# Patient Record
Sex: Female | Born: 1950 | Hispanic: No | Smoking: Never smoker
Health system: Southern US, Community
[De-identification: ages and names within clinical notes are randomized; demographics above are authoritative.]

## PROBLEM LIST (undated history)

## (undated) ENCOUNTER — Ambulatory Visit (HOSPITAL_COMMUNITY): Admission: EM | Payer: PRIVATE HEALTH INSURANCE | Source: Home / Self Care

## (undated) DIAGNOSIS — M1990 Unspecified osteoarthritis, unspecified site: Secondary | ICD-10-CM

## (undated) DIAGNOSIS — K5790 Diverticulosis of intestine, part unspecified, without perforation or abscess without bleeding: Secondary | ICD-10-CM

## (undated) DIAGNOSIS — K8020 Calculus of gallbladder without cholecystitis without obstruction: Secondary | ICD-10-CM

## (undated) HISTORY — PX: ABDOMINAL HYSTERECTOMY: SHX81

## (undated) HISTORY — DX: Unspecified osteoarthritis, unspecified site: M19.90

## (undated) HISTORY — DX: Diverticulosis of intestine, part unspecified, without perforation or abscess without bleeding: K57.90

## (undated) HISTORY — DX: Calculus of gallbladder without cholecystitis without obstruction: K80.20

---

## 2001-05-24 ENCOUNTER — Other Ambulatory Visit: Admission: RE | Admit: 2001-05-24 | Discharge: 2001-05-24 | Payer: Self-pay | Admitting: Obstetrics and Gynecology

## 2004-08-02 ENCOUNTER — Other Ambulatory Visit: Admission: RE | Admit: 2004-08-02 | Discharge: 2004-08-02 | Payer: Self-pay | Admitting: Obstetrics and Gynecology

## 2008-01-03 ENCOUNTER — Inpatient Hospital Stay (HOSPITAL_COMMUNITY): Admission: RE | Admit: 2008-01-03 | Discharge: 2008-01-05 | Payer: Self-pay | Admitting: Obstetrics and Gynecology

## 2008-01-03 ENCOUNTER — Encounter (INDEPENDENT_AMBULATORY_CARE_PROVIDER_SITE_OTHER): Payer: Self-pay | Admitting: Obstetrics and Gynecology

## 2008-02-01 ENCOUNTER — Ambulatory Visit: Payer: Self-pay | Admitting: Internal Medicine

## 2008-02-15 ENCOUNTER — Ambulatory Visit: Payer: Self-pay | Admitting: Internal Medicine

## 2009-01-15 ENCOUNTER — Encounter (HOSPITAL_COMMUNITY): Admission: RE | Admit: 2009-01-15 | Discharge: 2009-02-15 | Payer: Self-pay | Admitting: Family Medicine

## 2010-02-27 ENCOUNTER — Ambulatory Visit (HOSPITAL_COMMUNITY): Admission: RE | Admit: 2010-02-27 | Payer: Self-pay | Source: Home / Self Care | Admitting: Obstetrics and Gynecology

## 2010-03-10 ENCOUNTER — Encounter: Payer: Self-pay | Admitting: Family Medicine

## 2010-07-02 NOTE — Op Note (Signed)
Alyssa Mccormick, Alyssa Mccormick           ACCOUNT NO.:  [id number]   MEDICAL RECORD NO.:  [id number]          PATIENT TYPE:  INP   LOCATION:  9302                          FACILITY:  [facility]   PHYSICIAN:  Guy Sandifer. Henderson Cloud, M.D. DATE OF BIRTH:  04/09/1950   DATE OF PROCEDURE:  01/03/2008  DATE OF DISCHARGE:                               OPERATIVE REPORT   PREOPERATIVE DIAGNOSIS:  Pelvic relaxation.   POSTOPERATIVE DIAGNOSIS:  Pelvic relaxation.   PROCEDURE:  Laparoscopically-assisted vaginal hysterectomy, bilateral  salpingo-oophorectomy, anterior colporrhaphy, colpopexy, insertion of  mesh, posterior colporrhaphy, transobturator mid urethral sling, and  cystoscopy.   SURGEON:  Guy Sandifer. Henderson Cloud, MD   ASSISTANT:  Duke Salvia. Marcelle Overlie, MD   ANESTHESIA:  General with endotracheal intubation, Raul Del, MD   SPECIMENS:  Uterus, bilateral tubes, and ovaries to Pathology.   ESTIMATED BLOOD LOSS:  100 mL.   INDICATIONS AND CONSENT:  This patient is a 60 year old widowed white  female G2, P2 with increasing pelvic pressure and discomfort.  Details  in dictated history and physical.  LAVH/BSO, anterior repair with mesh,  posterior repair, and mid urethral sling was discussed preoperatively.  Potential risks and complications were discussed with the patient and  her daughter preoperatively including but not limited to infection,  organ damage, bleeding requiring transfusion of blood products with HIV  and hepatitis acquisition, DVT, PE, pneumonia, fistula formation,  laparotomy, vaginal narrowing, use of dilators, delayed healing,  erosion, success and failure rate of the sling, recurrence of  incontinence and pelvic relaxation, dyspareunia, prolonged  catheterization, self-catheterization, returned to the OR, irritative  voiding symptoms, and perineal pain.  All questions were answered and  consent was signed on the chart.   FINDINGS:  Upper abdomen is grossly normal.  Uterus is normal  in size  and contour.  Anterior and posterior cul-de-sacs were normal and the  ovaries normal bilaterally.   PROCEDURE:  The patient was taken to the operating room where she was  identified, placed in dorsal supine position, and general anesthesia was  induced via endotracheal intubation.  She was then placed in the dorsal  lithotomy position, where she was prepped abdominally and vaginally.  Bladder was straight catheterized.  Hulka tenaculum was placed in the  uterus as a manipulator and she was draped in sterile fashion.  The  infraumbilical and suprapubic areas were injected in the midline with  1.5% plain Marcaine.  A small infraumbilical incision was made and a  disposable Veress needle was placed on the first attempt without  difficulty.  A normal syringe and drop test were noted.  A 2 L of gas  were then insufflated under low pressure with good tympany in the right  upper quadrant.  Veress needle was removed and a 10/11 Xcel bladeless  disposable trocar sleeve was placed using direct visualization with the  diagnostic laparoscope.  After placement, the operative laparoscope was  placed.  A small suprapubic incision was made in the midline and a 5-mm  Xcel bladeless disposable trocar sleeve was placed under direct  visualization without difficulty.  The above findings were noted.  Then,  using the EndoSeal cautery device, the right infundibulopelvic ligament  was taken down, carried across the mesosalpinx, across the round  ligament, and down to the level of the vesicouterine peritoneum.  Similar procedure was carried out on the left.  This was well clear of  the course of the ureter.  The anterior vesicouterine peritoneum was  taken down cephalad laterally as well.  The suprapubic trocar sleeve was  removed.  Instruments were removed and attention was turned to the  vagina.  Posterior cul-de-sac was sharply entered and the cervix was  circumscribed with unipolar cautery.  Mucosa  was advanced sharply and  bluntly.  Then, using the handheld LigaSure cautery device, the  uterosacral ligaments followed by the bladder pillars, cardinal  ligaments, uterine vessels were taken down bilaterally.  Fundus with  tubes and ovaries were delivered posteriorly and the remaining pedicles  were taken down as well.  All suture will be 0 Monocryl unless otherwise  designated.  Uterosacral ligaments then plicated with vaginal cuff with  separate sutures.  They were plicated in the midline with a third  suture.  A suture was also placed at 3 and 9 o'clock position to assure  hemostasis and the cuff was closed with figure-of-eight in a vertical  fashion.  A transverse incision was made on the vagina approximately  halfway down on the anterior wall.  Dissection was then carried out of  the underlying bladder from the anterior mucosa.  This was carried  bluntly to the sacrospinous ligament and the ischial spines bilaterally.  Then, using the Capio needle driver, the arms of the uphold graft were  placed through the sacrospinous ligaments bilaterally at least one  fingerbreadth medial to the spine.  The arms were advanced.  Then, using  a single suture, the graft was plicated in the midline on its most  superior portion to the posterior part of the anterior vaginal mucosa to  mark the midline.  The arms were then pulled into place, providing  proper support which does a good job of elevating the vaginal cuff.  The  graft was laying flat with no kinks or rolls.  The sheath on the arms  was then removed intact bilaterally.  This followed, a pursestring  suture that had been placed on the base of the bladder.  The vaginal  mucosa was then reapproximated with a running locking 2-0 Monocryl  suture.  Next, a small infra-urethral incision was made after injection  with 1.5% Marcaine with 1:100,000 epinephrine.  The same solution was  also used prior to placement of the uphold graft.  Dissection  was  carried out bilaterally, sharply, and bluntly to the urogenital  diaphragm.  A Foley catheter was placed in the bladder.  Sites of the  obturator incisions were marked bilaterally and injected with the same  solution.  Stab incisions were made on the skin bilaterally.  Then, the  Obtryx halo needles were passed bilaterally through the obturator  foramen and exited below the urethra.  The course of the needle was  controlled with the examining finger.  The Foley catheter was removed.  Cystoscopy with a 70-degree cystoscope was carried out.  A 360-degree  inspection reveals no evidence of foreign body or perforation.  A good  puff of urine was noted from the ureteral orifices bilaterally.  Cystoscope was removed and Foley catheter was replaced and left in  place.  The polypropylene mesh sling was then placed into the needle and  after then  withdrawn back through the skin incisions.  Sheath was  removed intact.  Proper tensioning was placed on the sling with 2-3 mm  of space below the urethra to the sling noted.  The sling was lying flat  with no rolls or kinks.  The anterior vaginal mucosa was then closed in  a running fashion with 2-0 running locking Monocryl suture.  Excess  sling material was trimmed at the level of the skin.  Those incisions  were closed with Dermabond.  Posterior repair was carried out.  This  mucosa was also injected with the same solution.  Diamond shaped wedge  of tissue was removed from the posterior perineal body.  Posterior  vaginal mucosa was dissected from the underlying rectum in the midline  and then this was carried out bilaterally bluntly.  Rectovaginal fascia  was then reapproximated in the midline with interrupted 0 Monocryl pops.  This gives good support.  Perineal body was also dissected and  reapproximated with 0 Monocryl pops.  Posterior vaginal mucosa was  closed in a running locking fashion with 2-0 Monocryl suture which was  also carried down  to close the perineum in a standard episiotomy type  fashion.  Vaginal packing with estrogen cream was then placed.  Attention was returned to the abdomen.  Irrigation was carried out and  close inspection reveals a very minor oozing at peritoneal edge which  was easily controlled with bipolar cautery.  A 10 mL of 1.5% plain  Marcaine were then instilled into peritoneal cavity.  Suprapubic trocar  sleeve was removed and no bleeding was noted.  Pneumoperitoneum was completely reduced and the umbilical trocar sleeve  was removed.  Both incisions were closed with Dermabond.  All counts  were correct.  The patient was awakened, taken to recovery room in  stable condition.      Guy Sandifer Henderson Cloud, M.D.  Electronically Signed     JET/MEDQ  D:  01/03/2008  T:  01/04/2008  Job:  [id number]

## 2010-07-02 NOTE — H&P (Signed)
NAMEREJEANA, Alyssa Mccormick           ACCOUNT NO.:  [id number]   MEDICAL RECORD NO.:  [id number]          PATIENT TYPE:  AMB   LOCATION:                                FACILITY:  [facility]   PHYSICIAN:  Guy Sandifer. Henderson Cloud, M.D. DATE OF BIRTH:  [birth year]   DATE OF ADMISSION:  01/03/2008  DATE OF DISCHARGE:                              HISTORY & PHYSICAL   CHIEF COMPLAINT:  Pelvic relaxation and stress urinary incontinence.   HISTORY OF PRESENT ILLNESS:  The patient is a 60 year old widowed white  female G2, P2 who complained of increasing pelvic pressure and  discomfort with activity.  It limits her activities day to day and also  affects her when she works.  Urodynamic testing with a pessary in place  did produce stress urinary incontinence.  After careful discussion of  options as well as risks and benefits of patient's being admitted for  laparoscopically-assisted vaginal hysterectomy with bilateral salpingo-  oophorectomy, anterior-posterior vaginal repair with probable grafts,  and transobturator midurethral sling.   PAST MEDICAL HISTORY:  1. History of thyroid disease.  2. History of gallbladder disease.   PAST SURGICAL HISTORY:  Negative.   OBSTETRICAL HISTORY:  Vaginal delivery x2.   FAMILY HISTORY:  Throat cancer in her father.   ALLERGIES:  FRESH WATER FISH cause itching and shortness of breath.   MEDICATIONS:  Vitamins.   SOCIAL HISTORY:  Denies tobacco, alcohol, or drug abuse.   REVIEW OF SYSTEMS:  NEURO:  Denies headache.  CARDIAC:  Denies chest  pain.  PULMONARY:  Denies shortness of breath.  GI:  Denies recent  changes in bowel habits.   PHYSICAL EXAMINATION:  VITAL SIGNS:  Height 5 feet 2-1/4 inches, weight  157 pounds, blood pressure 164.  HEENT:  Without thyromegaly.  LUNGS:  Clear to auscultation.  HEART:  Regular rate and rhythm.  BACK:  Without CVA tenderness.  ABDOMEN:  Soft, nontender without masses.  PELVIC:  Vulva, vagina, and cervix without lesions.   Cervix descends to  just inside the vaginal introitus.  Uterus is normal size, nontender.  Adnexa nontender without masses.  Cystocele and rectocele are apparent.  Rectal exam reveals good sphincter tone and a thin rectovaginal septum.  EXTREMITIES:  Grossly within normal limits.  NEUROLOGIC:  Grossly within normal limits.   ASSESSMENT:  Symptomatic pelvic relaxation.   PLAN:  Laparoscopically-assisted vaginal hysterectomy, bilateral  salpingo-oophorectomy, anterior colporrhaphy with probable graft,  posterior colporrhaphy, transobturator midurethral sling.      Guy Sandifer Henderson Cloud, M.D.  Electronically Signed     JET/MEDQ  D:  12/27/2007  T:  12/28/2007  Job:  [id number]

## 2010-07-02 NOTE — Discharge Summary (Signed)
NAMEJALIYA, Alyssa Mccormick           ACCOUNT NO.:  [id number]   MEDICAL RECORD NO.:  [id number]          PATIENT TYPE:  INP   LOCATION:  9302                          FACILITY:  [facility]   PHYSICIAN:  Guy Sandifer. Henderson Cloud, M.D. DATE OF BIRTH:  1950-03-20   DATE OF ADMISSION:  01/03/2008  DATE OF DISCHARGE:  01/05/2008                               DISCHARGE SUMMARY   ADMITTING DIAGNOSIS:  Symptomatic pelvic relaxation.   DISCHARGE DIAGNOSIS:  Symptomatic pelvic relaxation.   PROCEDURES:  On January 03, 2008, is laparoscopically-assisted vaginal  hysterectomy, bilateral salpingo-oophorectomy, anterior colporrhaphy,  colpopexy, insertion of mesh, posterior colporrhaphy, transobturator  midurethral sling, and cystoscopy.   REASON FOR ADMISSION:  This patient is a 60 year old widowed white  female G2, P2 with increasingly symptomatic pelvic relaxation.  Details  dictated in the history and physical.  She was admitted for surgical  management.   HOSPITAL COURSE:  The patient was admitted to the hospital, taken to the  operating room, and undergoes the above procedure.   ESTIMATED BLOOD LOSS:  100 mL.   On the evening of surgery, she has good pain relief.  She is without  nausea or vomiting.  Clear urine output and vital signs are stable and  afebrile.  Abdomen is flat and soft with good bowel sounds.   On the first postoperative day, she has had a bowel movement, tolerating  regular diet, and ambulating.  Vital signs are stable.  She is afebrile.  Hemoglobin is 11.1.  Vaginal pack is out.  Through the day, she is able  to void.  However, her residual urine volumes increased from the 200s to  the 300s.  Foley catheter was placed overnight.  On the day of  discharge, she continues to have stable vital signs and is afebrile.  Foley catheter is removed and she voids with a residual urine volume of  less than 200 mL.  We will allow her to void one more time.  If the  residual volume is again  under 200 mL, she will be discharged home  without a catheter in place.   CONDITION ON DISCHARGE:  Good.   DIET:  Regular as tolerated.   ACTIVITY:  No lifting, no operation of automobiles, and no vaginal  entry.  She is to call our office for problems including but not limited  to temperature of 101 degrees, persistent nausea, vomiting, increasing  pain, or heavy vaginal bleeding.  __________ has been reviewed.   MEDICATIONS:  1. Percocet 5/325 mg #30, 1-2 p.o. q.6 h. p.r.n.  2. Ibuprofen 600 mg q.6 h. p.r.n.  3. Multivitamin daily.   FOLLOWUP:  Followup is in the office in 2 weeks.      Guy Sandifer Henderson Cloud, M.D.  Electronically Signed     JET/MEDQ  D:  01/05/2008  T:  01/05/2008  Job:  [id number]

## 2010-11-19 LAB — CBC
Hemoglobin: 11.1 — ABNORMAL LOW
MCHC: 34.3
MCV: 95.6
Platelets: 236
RDW: 12.1
RDW: 12.2
WBC: 6.2

## 2010-11-19 LAB — COMPREHENSIVE METABOLIC PANEL
AST: 23
Albumin: 3.8
Alkaline Phosphatase: 60
Chloride: 100
Creatinine, Ser: 0.61
GFR calc Af Amer: >60
Potassium: 3.8
Total Bilirubin: 0.8
Total Protein: 6.9

## 2013-10-31 ENCOUNTER — Encounter: Payer: Self-pay | Admitting: Internal Medicine

## 2014-11-17 ENCOUNTER — Other Ambulatory Visit: Payer: Self-pay | Admitting: Family Medicine

## 2014-11-17 ENCOUNTER — Other Ambulatory Visit: Payer: Self-pay

## 2014-11-18 LAB — CMP12+LP+TP+TSH+6AC+CBC/D/PLT
ALBUMIN: 4.1 g/dL (ref 3.6–4.8)
ALK PHOS: 71 IU/L (ref 39–117)
ALT: 17 IU/L (ref 0–32)
AST: 19 IU/L (ref 0–40)
Albumin/Globulin Ratio: 1.6 (ref 1.1–2.5)
BASOS: 0 %
BUN/Creatinine Ratio: 21 (ref 11–26)
BUN: 12 mg/dL (ref 8–27)
Basophils Absolute: 0 10*3/uL (ref 0.0–0.2)
Bilirubin Total: 0.6 mg/dL (ref 0.0–1.2)
CALCIUM: 9.3 mg/dL (ref 8.7–10.3)
CHLORIDE: 95 mmol/L — AB (ref 97–108)
CHOL/HDL RATIO: 3.2 ratio (ref 0.0–4.4)
CREATININE: 0.58 mg/dL (ref 0.57–1.00)
Cholesterol, Total: 235 mg/dL — ABNORMAL HIGH (ref 100–199)
EOS (ABSOLUTE): 0.2 10*3/uL (ref 0.0–0.4)
Eos: 1 %
Free Thyroxine Index: 2.2 (ref 1.2–4.9)
GFR calc Af Amer: 112 mL/min/{1.73_m2} (ref >59)
GFR, EST NON AFRICAN AMERICAN: 97 mL/min/{1.73_m2} (ref >59)
GGT: 8 IU/L (ref 0–60)
Globulin, Total: 2.5 g/dL (ref 1.5–4.5)
Glucose: 89 mg/dL (ref 65–99)
HDL: 74 mg/dL (ref >39)
HEMATOCRIT: 37.4 % (ref 34.0–46.6)
Hemoglobin: 12.7 g/dL (ref 11.1–15.9)
IMMATURE GRANS (ABS): 0 10*3/uL (ref 0.0–0.1)
Immature Granulocytes: 0 %
Iron: 18 ug/dL — ABNORMAL LOW (ref 27–139)
LDH: 187 IU/L (ref 119–226)
LDL CALC: 145 mg/dL — AB (ref 0–99)
LYMPHS ABS: 2.5 10*3/uL (ref 0.7–3.1)
Lymphs: 19 %
MCH: 31.7 pg (ref 26.6–33.0)
MCHC: 34 g/dL (ref 31.5–35.7)
MCV: 93 fL (ref 79–97)
MONOS ABS: 1 10*3/uL — AB (ref 0.1–0.9)
Monocytes: 7 %
NEUTROS ABS: 9.5 10*3/uL — AB (ref 1.4–7.0)
Neutrophils: 73 %
PHOSPHORUS: 3.2 mg/dL (ref 2.5–4.5)
POTASSIUM: 4.1 mmol/L (ref 3.5–5.2)
Platelets: 268 10*3/uL (ref 150–379)
RBC: 4.01 x10E6/uL (ref 3.77–5.28)
RDW: 13.1 % (ref 12.3–15.4)
SODIUM: 137 mmol/L (ref 134–144)
T3 Uptake Ratio: 28 % (ref 24–39)
T4, Total: 7.8 ug/dL (ref 4.5–12.0)
TSH: 2.06 u[IU]/mL (ref 0.450–4.500)
Total Protein: 6.6 g/dL (ref 6.0–8.5)
Triglycerides: 82 mg/dL (ref 0–149)
Uric Acid: 4.2 mg/dL (ref 2.5–7.1)
VLDL Cholesterol Cal: 16 mg/dL (ref 5–40)
WBC: 13.2 10*3/uL — ABNORMAL HIGH (ref 3.4–10.8)

## 2014-11-18 LAB — HGB A1C W/O EAG: Hgb A1c MFr Bld: 5.8 % — ABNORMAL HIGH (ref 4.8–5.6)

## 2014-11-28 DIAGNOSIS — E785 Hyperlipidemia, unspecified: Secondary | ICD-10-CM | POA: Insufficient documentation

## 2014-11-28 DIAGNOSIS — Z8719 Personal history of other diseases of the digestive system: Secondary | ICD-10-CM | POA: Insufficient documentation

## 2014-11-28 DIAGNOSIS — R109 Unspecified abdominal pain: Secondary | ICD-10-CM | POA: Insufficient documentation

## 2014-11-28 DIAGNOSIS — R7303 Prediabetes: Secondary | ICD-10-CM | POA: Insufficient documentation

## 2014-12-08 ENCOUNTER — Other Ambulatory Visit: Payer: Self-pay | Admitting: Family Medicine

## 2014-12-09 LAB — CMP12+LP+TP+TSH+6AC+CBC/D/PLT
A/G RATIO: 1.6 (ref 1.1–2.5)
ALK PHOS: 68 IU/L (ref 39–117)
ALT: 17 IU/L (ref 0–32)
AST: 21 IU/L (ref 0–40)
Albumin: 4.2 g/dL (ref 3.6–4.8)
BASOS: 0 %
BILIRUBIN TOTAL: 0.4 mg/dL (ref 0.0–1.2)
BUN/Creatinine Ratio: 30 — ABNORMAL HIGH (ref 11–26)
BUN: 18 mg/dL (ref 8–27)
Basophils Absolute: 0 10*3/uL (ref 0.0–0.2)
CHLORIDE: 99 mmol/L (ref 97–106)
CHOL/HDL RATIO: 4.2 ratio (ref 0.0–4.4)
CREATININE: 0.61 mg/dL (ref 0.57–1.00)
Calcium: 9.9 mg/dL (ref 8.7–10.3)
Cholesterol, Total: 266 mg/dL — ABNORMAL HIGH (ref 100–199)
EOS (ABSOLUTE): 0.2 10*3/uL (ref 0.0–0.4)
Eos: 3 %
Estimated CHD Risk: 0.9 times avg. (ref 0.0–1.0)
Free Thyroxine Index: 2.1 (ref 1.2–4.9)
GFR calc non Af Amer: 95 mL/min/{1.73_m2} (ref >59)
GFR, EST AFRICAN AMERICAN: 110 mL/min/{1.73_m2} (ref >59)
GGT: 8 IU/L (ref 0–60)
GLUCOSE: 96 mg/dL (ref 65–99)
Globulin, Total: 2.7 g/dL (ref 1.5–4.5)
HDL: 64 mg/dL (ref >39)
HEMATOCRIT: 40.2 % (ref 34.0–46.6)
Hemoglobin: 13.5 g/dL (ref 11.1–15.9)
IMMATURE GRANS (ABS): 0 10*3/uL (ref 0.0–0.1)
Immature Granulocytes: 0 %
Iron: 107 ug/dL (ref 27–139)
LDH: 188 IU/L (ref 119–226)
LDL CALC: 175 mg/dL — AB (ref 0–99)
LYMPHS: 36 %
Lymphocytes Absolute: 2.5 10*3/uL (ref 0.7–3.1)
MCH: 31.8 pg (ref 26.6–33.0)
MCHC: 33.6 g/dL (ref 31.5–35.7)
MCV: 95 fL (ref 79–97)
MONOCYTES: 6 %
Monocytes Absolute: 0.4 10*3/uL (ref 0.1–0.9)
NEUTROS ABS: 3.9 10*3/uL (ref 1.4–7.0)
Neutrophils: 55 %
POTASSIUM: 4.5 mmol/L (ref 3.5–5.2)
Phosphorus: 3.7 mg/dL (ref 2.5–4.5)
Platelets: 337 10*3/uL (ref 150–379)
RBC: 4.24 x10E6/uL (ref 3.77–5.28)
RDW: 12.8 % (ref 12.3–15.4)
SODIUM: 141 mmol/L (ref 136–144)
T3 Uptake Ratio: 26 % (ref 24–39)
T4, Total: 8.1 ug/dL (ref 4.5–12.0)
TOTAL PROTEIN: 6.9 g/dL (ref 6.0–8.5)
TRIGLYCERIDES: 134 mg/dL (ref 0–149)
TSH: 1.95 u[IU]/mL (ref 0.450–4.500)
URIC ACID: 5.1 mg/dL (ref 2.5–7.1)
VLDL Cholesterol Cal: 27 mg/dL (ref 5–40)
WBC: 7.1 10*3/uL (ref 3.4–10.8)

## 2014-12-09 LAB — HGB A1C W/O EAG: Hgb A1c MFr Bld: 5.6 % (ref 4.8–5.6)

## 2014-12-15 DIAGNOSIS — M8580 Other specified disorders of bone density and structure, unspecified site: Secondary | ICD-10-CM | POA: Insufficient documentation

## 2015-07-30 ENCOUNTER — Telehealth: Payer: Self-pay | Admitting: Internal Medicine

## 2015-07-30 ENCOUNTER — Encounter: Payer: Self-pay | Admitting: Internal Medicine

## 2015-07-30 NOTE — Telephone Encounter (Signed)
Patient's daughter is aware of appointment.   VC °

## 2015-07-30 NOTE — Telephone Encounter (Signed)
Patient's daughter is aware of appointment.   VC

## 2015-07-30 NOTE — Telephone Encounter (Signed)
Pt scheduled to see Doug SouJessica Zehr PA 08/03/15@2 :30pm. Please notify pt of appt date and time.

## 2015-08-03 ENCOUNTER — Other Ambulatory Visit (INDEPENDENT_AMBULATORY_CARE_PROVIDER_SITE_OTHER): Payer: PRIVATE HEALTH INSURANCE

## 2015-08-03 ENCOUNTER — Ambulatory Visit (INDEPENDENT_AMBULATORY_CARE_PROVIDER_SITE_OTHER): Payer: PRIVATE HEALTH INSURANCE | Admitting: Gastroenterology

## 2015-08-03 ENCOUNTER — Encounter: Payer: Self-pay | Admitting: Gastroenterology

## 2015-08-03 VITALS — BP 132/72 | HR 68 | Wt 166.2 lb

## 2015-08-03 DIAGNOSIS — R1032 Left lower quadrant pain: Secondary | ICD-10-CM

## 2015-08-03 DIAGNOSIS — Z8719 Personal history of other diseases of the digestive system: Secondary | ICD-10-CM

## 2015-08-03 DIAGNOSIS — K5730 Diverticulosis of large intestine without perforation or abscess without bleeding: Secondary | ICD-10-CM

## 2015-08-03 LAB — BASIC METABOLIC PANEL
BUN: 13 mg/dL (ref 6–23)
CHLORIDE: 98 meq/L (ref 96–112)
CO2: 34 mEq/L — ABNORMAL HIGH (ref 19–32)
Calcium: 10.4 mg/dL (ref 8.4–10.5)
Creatinine, Ser: 0.69 mg/dL (ref 0.40–1.20)
GFR: 90.35 mL/min (ref >60.00)
GLUCOSE: 90 mg/dL (ref 70–99)
POTASSIUM: 4.2 meq/L (ref 3.5–5.1)
SODIUM: 137 meq/L (ref 135–145)

## 2015-08-03 NOTE — Patient Instructions (Signed)
You have been scheduled for a CT scan of the abdomen and pelvis at [facility] ([address]---this is in the same building as [facility]).   You are scheduled on 08/07/2015 at 3:30pm. You should arrive 15 minutes prior to your appointment time for registration. Please follow the written instructions below on the day of your exam:  WARNING: IF YOU ARE ALLERGIC TO IODINE/X-RAY DYE, PLEASE NOTIFY RADIOLOGY IMMEDIATELY AT [phone]! YOU WILL BE GIVEN A 13 HOUR PREMEDICATION PREP.  1) Do not eat or drink anything after 11:30am (4 hours prior to your test) 2) You have been given 2 bottles of oral contrast to drink. The solution may taste  better if refrigerated, but do NOT add ice or any other liquid to this solution. Shake well before drinking.    Drink 1 bottle of contrast @ 1:30pm  (2 hours prior to your exam)  Drink 1 bottle of contrast @ 2:30pm (1 hour prior to your exam)  You may take any medications as prescribed with a small amount of water except for the following: Metformin, Glucophage, Glucovance, Avandamet, Riomet, Fortamet, Actoplus Met, Janumet, Glumetza or Metaglip. The above medications must be held the day of the exam AND 48 hours after the exam.  The purpose of you drinking the oral contrast is to aid in the visualization of your intestinal tract. The contrast solution may cause some diarrhea. Before your exam is started, you will be given a small amount of fluid to drink. Depending on your individual set of symptoms, you may also receive an intravenous injection of x-ray contrast/dye. Plan on being at [facility] for 30 minutes or long, depending on the type of exam you are having performed.  If you have any questions regarding your exam or if you need to reschedule, you may call the CT department at [phone] between the hours of 8:00 am and 5:00 pm, Monday-Friday.  ________________________________________________________________________

## 2015-08-03 NOTE — Progress Notes (Signed)
     08/03/2015 Alyssa JordanJelena Mccormick [id number] 01/09/1951   HISTORY OF PRESENT ILLNESS:  This is a [age] showed female who is previously known to Dr. Marina GoodellPerry for colonoscopy in December 2009 at which time she is found to have moderate diverticulosis in the sigmoid colon. She presents to our office today with complaints of left lower quadrant abdominal pain. Her daughter is present and provided translation.  Her daughter states that about 3 months ago her mother developed left lower quadrant abdominal pain. They are seen by her gynecologist who suspected that she had diverticulitis and treated her with antibiotics, however, they are not sure what exactly she was given.  Anyway, the pain did resolve but has now returned again.  She says that it varies in intensity but sometimes is very sharp.  She says that she has passed some mucus per rectum but has not seen any blood.  She denies nausea, vomiting, fevers, and chills.  Has a good appetite and no noticeable weight loss.  She does have occasional constipation with straining.   Past Medical History  Diagnosis Date  . Diverticulosis   . Gall stones    Past Surgical History  Procedure Laterality Date  . Abdominal hysterectomy      reports that she has never smoked. She has never used smokeless tobacco. She reports that she does not drink alcohol or use illicit drugs. family history includes Diabetes in her mother; Esophageal cancer in her father. There is no history of Colon cancer. No Known Allergies    Outpatient Encounter Prescriptions as of 08/03/2015  Medication Sig  . cholecalciferol (VITAMIN D) 1000 units tablet Take 1,000 Units by mouth daily.  . Multiple Vitamin (MULTIVITAMIN) tablet Take 1 tablet by mouth daily.  . Omega-3 Fatty Acids (FISH OIL PO) Take 1 capsule by mouth daily.   No facility-administered encounter medications on file as of 08/03/2015.    REVIEW OF SYSTEMS  : All other systems reviewed and negative except where noted in  the History of Present Illness.  PHYSICAL EXAM: BP 132/72 mmHg  Pulse 68  Wt 166 lb 3.2 oz (75.388 kg) General: Well developed white female in no acute distress Head: Normocephalic and atraumatic Eyes:  Sclerae anicteric, conjunctiva pink. Ears: Normal auditory acuity  Lungs: Clear throughout to auscultation Heart: Regular rate and rhythm Abdomen: Soft, non-distended.  Normal bowel sounds.  Mild LLQ TTP. Musculoskeletal: Symmetrical with no gross deformities  Skin: No lesions on visible extremities Extremities: No edema  Neurological: Alert oriented x 4, grossly non-focal Psychological:  Alert and cooperative. Normal mood and affect  ASSESSMENT AND PLAN: -65 year old female with LLQ abdominal pain:  Has a history of diverticulosis.  Treated with a course of antibiotics a couple of months ago and pain resolved.  Now has recurrent pain that is the same as previous.  Will schedule CT scan to evaluate for diverticulitis. We will contact the patient's pharmacy or her gynecologist office to confirm what antibiotics she was treated with.  CC:  No ref. provider found

## 2015-08-06 ENCOUNTER — Telehealth: Payer: Self-pay | Admitting: *Deleted

## 2015-08-06 NOTE — Telephone Encounter (Signed)
-----   Message from Leta BaptistJessica D Zehr, PA-C sent at 08/06/2015 12:53 PM EDT ----- Thank you!  Could you save this note to her chart, please!  ----- Message -----    From: Richardson Chiquitoorothy N Saylor Murry, CMA    Sent: 08/06/2015  10:50 AM      To: Leta BaptistJessica D Zehr, PA-C  Per pharmacy, patient was given cipro x 7 days. Of note, Pt also with zpak recently  ----- Message -----    From: Leta BaptistJessica D Zehr, PA-C    Sent: 08/03/2015  10:40 PM      To: Jeanine LuzLeslie K Wrenn, CMA  Just wanted to check and see if you called the patient's pharmacy or [facility] to see what antibiotics they treated her with?  Thank you,  Jess

## 2015-08-06 NOTE — Progress Notes (Signed)
Agree with initial assessment and plans 

## 2015-08-07 ENCOUNTER — Ambulatory Visit (INDEPENDENT_AMBULATORY_CARE_PROVIDER_SITE_OTHER)
Admission: RE | Admit: 2015-08-07 | Discharge: 2015-08-07 | Disposition: A | Discharge status: Home / Self Care | Payer: PRIVATE HEALTH INSURANCE | Source: Ambulatory Visit | Attending: Gastroenterology | Admitting: Gastroenterology

## 2015-08-07 DIAGNOSIS — R1032 Left lower quadrant pain: Secondary | ICD-10-CM | POA: Diagnosis not present

## 2015-08-07 MED ORDER — IOPAMIDOL (ISOVUE-300) INJECTION 61%
100.0000 mL | Freq: Once | INTRAVENOUS | Status: AC | PRN
Start: 2015-08-07 — End: 2015-08-07
  Administered 2015-08-07: 100 mL via INTRAVENOUS

## 2015-08-10 ENCOUNTER — Other Ambulatory Visit (INDEPENDENT_AMBULATORY_CARE_PROVIDER_SITE_OTHER): Payer: PRIVATE HEALTH INSURANCE

## 2015-08-10 ENCOUNTER — Encounter: Payer: Self-pay | Admitting: *Deleted

## 2015-08-10 ENCOUNTER — Other Ambulatory Visit: Payer: Self-pay | Admitting: *Deleted

## 2015-08-10 ENCOUNTER — Telehealth: Payer: Self-pay | Admitting: Gastroenterology

## 2015-08-10 DIAGNOSIS — R935 Abnormal findings on diagnostic imaging of other abdominal regions, including retroperitoneum: Secondary | ICD-10-CM

## 2015-08-10 DIAGNOSIS — R109 Unspecified abdominal pain: Secondary | ICD-10-CM | POA: Diagnosis not present

## 2015-08-10 LAB — HEPATIC FUNCTION PANEL
ALT: 14 U/L (ref 0–35)
AST: 18 U/L (ref 0–37)
Albumin: 4.2 g/dL (ref 3.5–5.2)
Alkaline Phosphatase: 63 U/L (ref 39–117)
BILIRUBIN DIRECT: 0 mg/dL (ref 0.0–0.3)
TOTAL PROTEIN: 7.3 g/dL (ref 6.0–8.3)
Total Bilirubin: 0.3 mg/dL (ref 0.2–1.2)

## 2015-08-17 ENCOUNTER — Ambulatory Visit (HOSPITAL_COMMUNITY)
Admission: RE | Admit: 2015-08-17 | Discharge: 2015-08-17 | Disposition: A | Discharge status: Home / Self Care | Payer: No Typology Code available for payment source | Source: Ambulatory Visit | Attending: Gastroenterology | Admitting: Gastroenterology

## 2015-08-17 DIAGNOSIS — N281 Cyst of kidney, acquired: Secondary | ICD-10-CM | POA: Diagnosis not present

## 2015-08-17 DIAGNOSIS — K8020 Calculus of gallbladder without cholecystitis without obstruction: Secondary | ICD-10-CM | POA: Diagnosis not present

## 2015-08-17 DIAGNOSIS — R935 Abnormal findings on diagnostic imaging of other abdominal regions, including retroperitoneum: Secondary | ICD-10-CM | POA: Diagnosis present

## 2015-08-17 MED ORDER — GADOBENATE DIMEGLUMINE 529 MG/ML IV SOLN
15.0000 mL | Freq: Once | INTRAVENOUS | Status: AC | PRN
  Administered 2015-08-17: 15 mL via INTRAVENOUS

## 2015-08-22 ENCOUNTER — Other Ambulatory Visit: Payer: Self-pay | Admitting: *Deleted

## 2015-08-22 ENCOUNTER — Telehealth: Payer: Self-pay | Admitting: Gastroenterology

## 2015-08-22 DIAGNOSIS — R1032 Left lower quadrant pain: Secondary | ICD-10-CM

## 2015-08-22 DIAGNOSIS — K5900 Constipation, unspecified: Secondary | ICD-10-CM

## 2015-09-13 ENCOUNTER — Other Ambulatory Visit: Payer: Self-pay | Admitting: Family Medicine

## 2015-09-14 LAB — CMP12+LP+TP+TSH+6AC+CBC/D/PLT
A/G RATIO: 1.7 (ref 1.2–2.2)
ALK PHOS: 73 IU/L (ref 39–117)
ALT: 20 IU/L (ref 0–32)
AST: 25 IU/L (ref 0–40)
Albumin: 4.3 g/dL (ref 3.6–4.8)
BILIRUBIN TOTAL: 0.3 mg/dL (ref 0.0–1.2)
BUN/Creatinine Ratio: 22 (ref 12–28)
BUN: 14 mg/dL (ref 8–27)
Basophils Absolute: 0 10*3/uL (ref 0.0–0.2)
Basos: 0 %
CHOL/HDL RATIO: 3.9 ratio (ref 0.0–4.4)
CREATININE: 0.64 mg/dL (ref 0.57–1.00)
Calcium: 9.6 mg/dL (ref 8.7–10.3)
Chloride: 97 mmol/L (ref 96–106)
Cholesterol, Total: 268 mg/dL — ABNORMAL HIGH (ref 100–199)
EOS (ABSOLUTE): 0.2 10*3/uL (ref 0.0–0.4)
EOS: 3 %
Estimated CHD Risk: 0.8 times avg. (ref 0.0–1.0)
Free Thyroxine Index: 2.1 (ref 1.2–4.9)
GFR calc Af Amer: 108 mL/min/{1.73_m2} (ref >59)
GFR, EST NON AFRICAN AMERICAN: 93 mL/min/{1.73_m2} (ref >59)
GGT: 10 IU/L (ref 0–60)
GLUCOSE: 83 mg/dL (ref 65–99)
Globulin, Total: 2.5 g/dL (ref 1.5–4.5)
HDL: 69 mg/dL (ref >39)
HEMOGLOBIN: 13.7 g/dL (ref 11.1–15.9)
Hematocrit: 40.6 % (ref 34.0–46.6)
Immature Grans (Abs): 0 10*3/uL (ref 0.0–0.1)
Immature Granulocytes: 0 %
Iron: 80 ug/dL (ref 27–139)
LDH: 193 IU/L (ref 119–226)
LDL CALC: 167 mg/dL — AB (ref 0–99)
LYMPHS: 40 %
Lymphocytes Absolute: 2.3 10*3/uL (ref 0.7–3.1)
MCH: 31.1 pg (ref 26.6–33.0)
MCHC: 33.7 g/dL (ref 31.5–35.7)
MCV: 92 fL (ref 79–97)
MONOCYTES: 7 %
MONOS ABS: 0.4 10*3/uL (ref 0.1–0.9)
NEUTROS ABS: 2.8 10*3/uL (ref 1.4–7.0)
Neutrophils: 50 %
PHOSPHORUS: 3.9 mg/dL (ref 2.5–4.5)
POTASSIUM: 4.2 mmol/L (ref 3.5–5.2)
Platelets: 300 10*3/uL (ref 150–379)
RBC: 4.4 x10E6/uL (ref 3.77–5.28)
RDW: 13.3 % (ref 12.3–15.4)
SODIUM: 140 mmol/L (ref 134–144)
T3 Uptake Ratio: 25 % (ref 24–39)
T4, Total: 8.3 ug/dL (ref 4.5–12.0)
TRIGLYCERIDES: 162 mg/dL — AB (ref 0–149)
TSH: 2.4 u[IU]/mL (ref 0.450–4.500)
Total Protein: 6.8 g/dL (ref 6.0–8.5)
URIC ACID: 5.5 mg/dL (ref 2.5–7.1)
VLDL CHOLESTEROL CAL: 32 mg/dL (ref 5–40)
WBC: 5.7 10*3/uL (ref 3.4–10.8)

## 2015-09-14 LAB — VITAMIN D 25 HYDROXY (VIT D DEFICIENCY, FRACTURES): VIT D 25 HYDROXY: 49.4 ng/mL (ref 30.0–100.0)

## 2015-09-14 LAB — HGB A1C W/O EAG: Hgb A1c MFr Bld: 5.4 % (ref 4.8–5.6)

## 2015-10-01 ENCOUNTER — Ambulatory Visit: Payer: Self-pay | Admitting: Internal Medicine

## 2015-10-12 ENCOUNTER — Ambulatory Visit (AMBULATORY_SURGERY_CENTER): Payer: Self-pay | Admitting: *Deleted

## 2015-10-12 VITALS — Ht 61.0 in | Wt 171.0 lb

## 2015-10-12 DIAGNOSIS — R1032 Left lower quadrant pain: Secondary | ICD-10-CM

## 2015-10-12 MED ORDER — NA SULFATE-K SULFATE-MG SULF 17.5-3.13-1.6 GM/177ML PO SOLN
1.0000 | Freq: Once | ORAL | 0 refills | Status: AC
Start: 2015-10-12 — End: 2015-10-12

## 2015-10-12 NOTE — Progress Notes (Signed)
No egg or soy allergy known to patient  No issues with past sedation with any surgeries  or procedures, no intubation problems  No diet pills per patient No home 02 use per patient  No blood thinners per patient  Pt current  issues with constipation but has hx of this on and off with the LLQ abd pain  No A fib or A flutter  Daughter in pV today to translate - pt croatian - she understands english but speaks little

## 2015-10-15 ENCOUNTER — Encounter: Payer: Self-pay | Admitting: Internal Medicine

## 2015-10-19 ENCOUNTER — Ambulatory Visit (AMBULATORY_SURGERY_CENTER): Payer: PRIVATE HEALTH INSURANCE | Admitting: Internal Medicine

## 2015-10-19 ENCOUNTER — Encounter: Payer: Self-pay | Admitting: Internal Medicine

## 2015-10-19 VITALS — BP 133/64 | HR 51 | Temp 97.1°F | Resp 13 | Ht 61.0 in | Wt 171.0 lb

## 2015-10-19 DIAGNOSIS — K5730 Diverticulosis of large intestine without perforation or abscess without bleeding: Secondary | ICD-10-CM

## 2015-10-19 DIAGNOSIS — K5901 Slow transit constipation: Secondary | ICD-10-CM

## 2015-10-19 DIAGNOSIS — R1032 Left lower quadrant pain: Secondary | ICD-10-CM

## 2015-10-19 MED ORDER — SODIUM CHLORIDE 0.9 % IV SOLN: 500.0000 mL | INTRAVENOUS | Status: DC

## 2015-10-19 NOTE — Patient Instructions (Addendum)
YOU HAD AN ENDOSCOPIC PROCEDURE TODAY AT THE [facility]:   Refer to the procedure report that was given to you for any specific questions about what was found during the examination.  If the procedure report does not answer your questions, please call your gastroenterologist to clarify.  If you requested that your care partner not be given the details of your procedure findings, then the procedure report has been included in a sealed envelope for you to review at your convenience later.  YOU SHOULD EXPECT: Some feelings of bloating in the abdomen. Passage of more gas than usual.  Walking can help get rid of the air that was put into your GI tract during the procedure and reduce the bloating. If you had a lower endoscopy (such as a colonoscopy or flexible sigmoidoscopy) you may notice spotting of blood in your stool or on the toilet paper. If you underwent a bowel prep for your procedure, you may not have a normal bowel movement for a few days.  Please Note:  You might notice some irritation and congestion in your nose or some drainage.  This is from the oxygen used during your procedure.  There is no need for concern and it should clear up in a day or so.  SYMPTOMS TO REPORT IMMEDIATELY:   Following lower endoscopy (colonoscopy or flexible sigmoidoscopy):  Excessive amounts of blood in the stool  Significant tenderness or worsening of abdominal pains  Swelling of the abdomen that is new, acute  Fever of 100F or higher   Following upper endoscopy (EGD)  Vomiting of blood or coffee ground material  New chest pain or pain under the shoulder blades  Painful or persistently difficult swallowing  New shortness of breath  Fever of 100F or higher  Black, tarry-looking stools  For urgent or emergent issues, a gastroenterologist can be reached at any hour by calling [phone].   DIET:  We do recommend a small meal at first, but then you may proceed to your regular diet.  Drink  plenty of fluids but you should avoid alcoholic beverages for 24 hours.  ACTIVITY:  You should plan to take it easy for the rest of today and you should NOT DRIVE or use heavy machinery until tomorrow (because of the sedation medicines used during the test).    FOLLOW UP: Our staff will call the number listed on your records the next business day following your procedure to check on you and address any questions or concerns that you may have regarding the information given to you following your procedure. If we do not reach you, we will leave a message.  However, if you are feeling well and you are not experiencing any problems, there is no need to return our call.  We will assume that you have returned to your regular daily activities without incident.  If any biopsies were taken you will be contacted by phone or by letter within the next 1-3 weeks.  Please call us at [phone] if you have not heard about the biopsies in 3 weeks.    SIGNATURES/CONFIDENTIALITY: You and/or your care partner have signed paperwork which will be entered into your electronic medical record.  These signatures attest to the fact that that the information above on your After Visit Summary has been reviewed and is understood.  Full responsibility of the confidentiality of this discharge information lies with you and/or your care-partner  Recall 10 years-2027  Diverticulosis and high fiber diet information given.

## 2015-10-19 NOTE — Op Note (Signed)
[facility] Patient Name: Alyssa Mccormick Procedure Date: 10/19/2015 12:56 PM MRN: [id number] Endoscopist: Wilhemina Bonito. Marina Goodell , MD Age: 65 Referring MD:  Date of Birth: Mar 01, 1950 Gender: Female Account #: [id number] Procedure:                Colonoscopy Indications:              Abdominal pain in the left lower quadrant,                            Constipation. Examination 2009 with diverticulosis Medicines:                Monitored Anesthesia Care Procedure:                Pre-Anesthesia Assessment:                           - Prior to the procedure, a History and Physical                            was performed, and patient medications and                            allergies were reviewed. The patient's tolerance of                            previous anesthesia was also reviewed. The risks                            and benefits of the procedure and the sedation                            options and risks were discussed with the patient.                            All questions were answered, and informed consent                            was obtained. Prior Anticoagulants: The patient has                            taken no previous anticoagulant or antiplatelet                            agents. ASA Grade Assessment: II - A patient with                            mild systemic disease. After reviewing the risks                            and benefits, the patient was deemed in                            satisfactory condition to undergo the procedure.  After obtaining informed consent, the colonoscope                            was passed under direct vision. Throughout the                            procedure, the patient's blood pressure, pulse, and                            oxygen saturations were monitored continuously. The                            Model CF-HQ190L [id number]) scope was introduced                            through the  anus and advanced to the the cecum,                            identified by appendiceal orifice and ileocecal                            valve. The ileocecal valve, appendiceal orifice,                            and rectum were photographed. The quality of the                            bowel preparation was excellent. The colonoscopy                            was performed without difficulty. The patient                            tolerated the procedure well. The bowel preparation                            used was SUPREP. Scope In: 1:00:53 PM Scope Out: 1:13:19 PM Scope Withdrawal Time: 0 hours 9 minutes 20 seconds  Total Procedure Duration: 0 hours 12 minutes 26 seconds  Findings:                 Multiple diverticula were found in the sigmoid                            colon, cecum and right colon.                           Internal hemorrhoids were found during retroflexion.                           The exam was otherwise without abnormality on                            direct and retroflexion views. Complications:  No immediate complications. Estimated blood loss:                            None. Estimated Blood Loss:     Estimated blood loss: none. Impression:               - Diverticulosis in the sigmoid colon, in the cecum                            and in the right colon.                           - Internal hemorrhoids.                           - The examination was otherwise normal on direct                            and retroflexion views.                           - No specimens collected. Recommendation:           - Repeat colonoscopy in 10 years for screening                            purposes.                           - Patient has a contact number available for                            emergencies. The signs and symptoms of potential                            delayed complications were discussed with the                            patient.  Return to normal activities tomorrow.                            Written discharge instructions were provided to the                            patient.                           - Resume previous diet.                           - Continue present medications. Wilhemina BonitoJohn N. Marina GoodellPerry, MD 10/19/2015 1:22:01 PM This report has been signed electronically.

## 2015-10-19 NOTE — Progress Notes (Signed)
Report to PACU, RN, vss, BBS= Clear.  

## 2015-10-23 ENCOUNTER — Telehealth: Payer: Self-pay

## 2015-10-23 NOTE — Telephone Encounter (Signed)
Pt was gone to work.  I spoke with her son-in-law and I asked him to please tell the pt we called and for her to call us back if any questions or concerns. [email or web address]

## 2016-08-11 ENCOUNTER — Ambulatory Visit: Payer: Self-pay | Admitting: Registered Nurse

## 2016-08-11 VITALS — BP 114/74 | HR 80 | Temp 97.5°F

## 2016-08-11 DIAGNOSIS — M533 Sacrococcygeal disorders, not elsewhere classified: Secondary | ICD-10-CM

## 2016-08-11 MED ORDER — ACETAMINOPHEN 500 MG PO TABS: 500.0000 mg | ORAL_TABLET | Freq: Four times a day (QID) | ORAL | 0 refills | Status: DC | PRN

## 2016-08-11 MED ORDER — IBUPROFEN 800 MG PO TABS: 800.0000 mg | ORAL_TABLET | Freq: Three times a day (TID) | ORAL | 0 refills | Status: AC | PRN

## 2016-08-11 NOTE — Progress Notes (Signed)
Subjective:    Patient ID: Alyssa Mccormick, female    DOB: [birth year], 66 y.o.   MRN: [id number]  66y/o caucasian female established patient reports R hip pain x1 month. Non radiating. Worse with ambulation. Pain only occurs with movement and upon waking in the am. No pain while sitting. Also with R shin pain, unsure if related. Has some prominent varicose veins present in that area.  Has tried ibuprofen at home with some relief of pain.  Reported no pain since taking motrin this morning.  Has not tried heat/ice.  Rotates shoes but more than 3 months old all pairs.  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  Patient reports family history arthritis paternal grandparents.  Denies rash, swelling, erythema hips/back.  Pain does radiate down right leg to shin.  PMHx varicose veins, prediabetes, obesity, osteopenia.  Reviewed care everywhere no imaging back/hips on file.  English is second language.  Denied change in exercise routine, home/stairs, changes in hobby activities      Review of Systems  Constitutional: Negative for chills, diaphoresis and fever.  HENT: Negative for drooling and trouble swallowing.   Eyes: Negative for pain and discharge.  Respiratory: Negative for cough and wheezing.   Cardiovascular: Negative for chest pain and leg swelling.  Gastrointestinal: Negative for blood in stool, diarrhea and vomiting.  Genitourinary: Negative for difficulty urinating and hematuria.  Musculoskeletal: Positive for arthralgias and myalgias. Negative for back pain, gait problem, joint swelling, neck pain and neck stiffness.  Skin: Negative for color change, rash and wound.  Allergic/Immunologic: Negative for environmental allergies and food allergies.  Neurological: Negative for tremors, syncope, weakness, numbness and headaches.  Hematological: Negative for adenopathy. Does not bruise/bleed easily.  Psychiatric/Behavioral: Negative for agitation,  confusion and sleep disturbance. The patient is not nervous/anxious.        Objective:   Physical Exam  Constitutional: She is oriented to person, place, and time. Vital signs are normal. She appears well-developed and well-nourished. She is active and cooperative.  Non-toxic appearance. She does not have a sickly appearance. She does not appear ill. No distress.  HENT:  Head: Normocephalic and atraumatic.  Right Ear: Hearing, external ear and ear canal normal.  Left Ear: Hearing and external ear normal.  Nose: Nose normal. No mucosal edema, rhinorrhea, nose lacerations, sinus tenderness, nasal deformity, septal deviation or nasal septal hematoma. No epistaxis.  No foreign bodies.  Mouth/Throat: Uvula is midline, oropharynx is clear and moist and mucous membranes are normal. Mucous membranes are not pale, not dry and not cyanotic. She does not have dentures. No oral lesions. No trismus in the jaw. Normal dentition. No dental abscesses, uvula swelling, lacerations or dental caries. No oropharyngeal exudate, posterior oropharyngeal edema, posterior oropharyngeal erythema or tonsillar abscesses.  Eyes: Conjunctivae, EOM and lids are normal. Pupils are equal, round, and reactive to light. Right eye exhibits no chemosis, no discharge, no exudate and no hordeolum. No foreign body present in the right eye. Left eye exhibits no chemosis, no discharge, no exudate and no hordeolum. No foreign body present in the left eye. Right conjunctiva is not injected. Right conjunctiva has no hemorrhage. Left conjunctiva is not injected. Left conjunctiva has no hemorrhage. No scleral icterus. Right eye exhibits normal extraocular motion and no nystagmus. Left eye exhibits normal extraocular motion and no nystagmus. Right pupil is round and reactive. Left pupil is round and reactive. Pupils are equal.  Neck: Trachea normal, normal range of motion and  phonation normal. Neck supple. No tracheal tenderness, no spinous process  tenderness and no muscular tenderness present. No neck rigidity. No tracheal deviation, no edema, no erythema and normal range of motion present. No thyroid mass and no thyromegaly present.  Cardiovascular: Normal rate, regular rhythm, S1 normal, S2 normal, normal heart sounds and intact distal pulses.  PMI is not displaced.  Exam reveals no gallop and no friction rub.   No murmur heard. Pulses:      Radial pulses are 2+ on the right side, and 2+ on the left side.  Pulmonary/Chest: Effort normal and breath sounds normal. No accessory muscle usage or stridor. No respiratory distress. She has no decreased breath sounds. She has no wheezes. She has no rhonchi. She has no rales. She exhibits no tenderness.  Abdominal: Soft. She exhibits no distension.  Musculoskeletal: Normal range of motion. She exhibits no edema.       Right shoulder: Normal.       Left shoulder: Normal.       Right hip: Normal.       Left hip: Normal.       Right knee: Normal.       Left knee: Normal.       Right ankle: Normal.       Left ankle: Normal.       Cervical back: Normal.       Thoracic back: Normal.       Lumbar back: She exhibits tenderness. She exhibits normal range of motion, no bony tenderness, no swelling, no edema, no deformity, no laceration, no pain, no spasm and normal pulse.       Back:       Right hand: Normal.       Left hand: Normal.       Right lower leg: Normal.       Left lower leg: Normal.  Lymphadenopathy:       Head (right side): No submental, no submandibular, no tonsillar, no preauricular, no posterior auricular and no occipital adenopathy present.       Head (left side): No submental, no submandibular, no tonsillar, no preauricular, no posterior auricular and no occipital adenopathy present.    She has no cervical adenopathy.       Right cervical: No superficial cervical, no deep cervical and no posterior cervical adenopathy present.      Left cervical: No superficial cervical, no deep  cervical and no posterior cervical adenopathy present.  Neurological: She is alert and oriented to person, place, and time. She has normal strength. She is not disoriented. She displays no atrophy and no tremor. No cranial nerve deficit or sensory deficit. She exhibits normal muscle tone. She displays no seizure activity. Coordination and gait normal. GCS eye subscore is 4. GCS verbal subscore is 5. GCS motor subscore is 6.  Bilateral hand grasp and extremity strength 5/5  Skin: Skin is warm, dry and intact. No abrasion, no bruising, no burn, no ecchymosis, no laceration, no lesion, no petechiae and no rash noted. She is not diaphoretic. No cyanosis or erythema. No pallor. Nails show no clubbing.  Psychiatric: She has a normal mood and affect. Her speech is normal and behavior is normal. Judgment and thought content normal. Cognition and memory are normal.  Nursing note and vitals reviewed.         Assessment & Plan:  A-right SI joint pain  P-For acute pain, rest, and intermittent application of heat (do not sleep on heating pad).  I  discussed longer-term treatment plan of PRN PO NSAIDS and motrin 800mg  po TID prn pain #30 RF0 take with food dispensed from [employer] to patient and I discussed a home back care exercise program with a strengthening and flexibility exercise.  Patient given [employer] handout on SI joint dysfunction, cryotherapy and heat therapy.  Given 4 UD biofreeze gel may apply QID prn pain and given heat/cold pack from clinic stock for home use. Encouraged weight loss as BMI 30 and using standing mat at workstation.  Recommended replacing worn shoes. Proper avoidance of heavy lifting discussed.  Consider imaging, physical therapy or chiropractic care and radiology if not improving.  Call or return to clinic as needed if these symptoms worsen or fail to improve as anticipated especially leg weakness, loss of bowel/bladder control or saddle paresthesias.   Patient verbalized understanding of  instructions/information and agreed with plan of care.  P2:  Injury Prevention, fitness

## 2016-08-11 NOTE — Patient Instructions (Addendum)
Sacroiliac Joint Dysfunction Sacroiliac joint dysfunction is a condition that causes inflammation on one or both sides of the sacroiliac (SI) joint. The SI joint connects the lower part of the spine (sacrum) with the two upper portions of the pelvis (ilium). This condition causes deep aching or burning pain in the low back. In some cases, the pain may also spread into one or both buttocks or hips or spread down the legs. What are the causes? This condition may be caused by:  Pregnancy. During pregnancy, extra stress is put on the SI joints because the pelvis widens.  Injury, such as: ? Car accidents. ? Sport-related injuries. ? Work-related injuries.  Having one leg that is shorter than the other.  Conditions that affect the joints, such as: ? Rheumatoid arthritis. ? Gout. ? Psoriatic arthritis. ? Joint infection (septic arthritis).  Sometimes, the cause of SI joint dysfunction is not known. What are the signs or symptoms? Symptoms of this condition include:  Aching or burning pain in the lower back. The pain may also spread to other areas, such as: ? Buttocks. ? Groin. ? Thighs and legs.  Muscle spasms in or around the painful areas.  Increased pain when standing, walking, running, stair climbing, bending, or lifting.  How is this diagnosed? Your health care provider will do a physical exam and take your medical history. During the exam, the health care provider may move one or both of your legs to different positions to check for pain. Various tests may be done to help verify the diagnosis, including:  Imaging tests to look for other causes of pain. These may include: ? MRI. ? CT scan. ? Bone scan.  Diagnostic injection. A numbing medicine is injected into the SI joint using a needle. If the pain is temporarily improved or stopped after the injection, this can indicate that SI joint dysfunction is the problem.  How is this treated? Treatment may vary depending on the  cause and severity of your condition. Treatment options may include:  Applying ice or heat to the lower back area. This can help to reduce pain and muscle spasms.  Medicines to relieve pain or inflammation or to relax the muscles.  Wearing a back brace (sacroiliac brace) to help support the joint while your back is healing.  Physical therapy to increase muscle strength around the joint and flexibility at the joint. This may also involve learning proper body positions and ways of moving to relieve stress on the joint.  Direct manipulation of the SI joint.  Injections of steroid medicine into the joint in order to reduce pain and swelling.  Radiofrequency ablation to burn away nerves that are carrying pain messages from the joint.  Use of a device that provides electrical stimulation in order to reduce pain at the joint.  Surgery to put in screws and plates that limit or prevent joint motion. This is rare.  Follow these instructions at home:  Rest as needed. Limit your activities as directed by your health care provider.  Take medicines only as directed by your health care provider.  If directed, apply ice to the affected area: ? Put ice in a plastic bag. ? Place a towel between your skin and the bag. ? Leave the ice on for 20 minutes, 2-3 times per day.  Use a heating pad or a moist heat pack as directed by your health care provider.  Exercise as directed by your health care provider or physical therapist.  Keep all follow-up visits   as directed by your health care provider. This is important. Contact a health care provider if:  Your pain is not controlled with medicine.  You have a fever.  You have increasingly severe pain. Get help right away if:  You have weakness, numbness, or tingling in your legs or feet.  You lose control of your bladder or bowel. This information is not intended to replace advice given to you by your health care provider. Make sure you discuss  any questions you have with your health care provider. Document Released: 05/02/2008 Document Revised: 07/12/2015 Document Reviewed: 10/11/2013 Elsevier Interactive Patient Education  2018 [employer] Sacroiliac Stretch    Using one or two hands, pull knee toward armpit on same side. Hold ____ seconds. Repeat with other knee. Repeat ____ times. Do ____ sessions per day.  http://gt2.exer.us/254   Copyright  VHI. All rights reserved.   Heat Therapy Heat therapy can help ease sore, stiff, injured, and tight muscles and joints. Heat relaxes your muscles, which may help ease your pain. What are the risks? If you have any of the following conditions, do not use heat therapy unless your health care provider has approved:  Poor circulation.  Healing wounds or scarred skin in the area being treated.  Diabetes, heart disease, or high blood pressure.  Not being able to feel (numbness) the area being treated.  Unusual swelling of the area being treated.  Active infections.  Blood clots.  Cancer.  Inability to communicate pain. This may include young children and people who have problems with their brain function (dementia).  Pregnancy.  Heat therapy should only be used on old, pre-existing, or long-lasting (chronic) injuries. Do not use heat therapy on new injuries unless directed by your health care provider. How to use heat therapy There are several different kinds of heat therapy, including:  Moist heat pack.  Warm water bath.  Hot water bottle.  Electric heating pad.  Heated gel pack.  Heated wrap.  Electric heating pad.  Use the heat therapy method suggested by your health care provider. Follow your health care provider's instructions on when and how to use heat therapy. General heat therapy recommendations  Do not sleep while using heat therapy. Only use heat therapy while you are awake.  Your skin may turn pink while using heat therapy. Do not use heat  therapy if your skin turns red.  Do not use heat therapy if you have new pain.  High heat or long exposure to heat can cause burns. Be careful when using heat therapy to avoid burning your skin.  Do not use heat therapy on areas of your skin that are already irritated, such as with a rash or sunburn. Contact a health care provider if:  You have blisters, redness, swelling, or numbness.  You have new pain.  Your pain is worse. This information is not intended to replace advice given to you by your health care provider. Make sure you discuss any questions you have with your health care provider. Document Released: 04/28/2011 Document Revised: 07/12/2015 Document Reviewed: 03/29/2013 Elsevier Interactive Patient Education  2018 Elsevier Inc.  Cryotherapy WHAT IS CRYOTHERAPY? Cryotherapy, or cold therapy, is a treatment that uses cold temperatures to treat an injury or medical condition. It includes using cold packs or ice packs to reduce pain and swelling. WHO SHOULD NOT USE CRYOTHERAPY? Cryotherapy is not safe for people who cannot tell you if they are in pain, such as small children and people who have dementia. Cryotherapy  is also not safe for people with certain conditions, such as:  Raynaud phenomenon.  Cold hypersensitivity.  Numbness or loss of feeling in the area being iced.  Cryotherapy may or may not be safe for people with certain other conditions. Do not use cryotherapy without your health care provider's approval if you have:  A heart condition.  High blood pressure.  Open or healing wounds.  An infection.  Rheumatoid arthritis.  Poor circulation.  Diabetes.  Certain skin conditions.  HOW DO I USE CRYOTHERAPY? To use cryotherapy at home to reduce pain and swelling:  Place a towel between the cold source and your skin.  Apply the cold source for no more than 20 minutes at a time.  Check your skin after 5 minutes to make sure there are no signs of a  poor response to cold or skin damage. Check for: ? White spots on your skin. Your skin may look blotchy or mottled. ? Skin that looks blue or pale. ? Skin that feels waxy or hard.  Repeat these steps as many times each day as told by your health care provider.  HOW CAN I MAKE A COLD PACK? When using a cold pack at home to reduce pain and swelling, you can use:  A silica gel cold pack that has been left in the freezer. You can buy this online or in stores.  A plastic bag of frozen vegetables.  A sealable plastic bag that has been filled with crushed ice.  Always wrap the pack in a dry or damp towel to avoid direct contact with your skin. WHEN SHOULD I CALL MY HEALTH CARE PROVIDER? Call your health care provider if:  You develop white spots on your skin. This may give your skin a blotchy or mottled look.  Your skin turns blue or pale.  Your skin becomes waxy or hard.  Your swelling gets worse.  This information is not intended to replace advice given to you by your health care provider. Make sure you discuss any questions you have with your health care provider. Document Released: 09/30/2010 Document Revised: 07/12/2015 Document Reviewed: 10/18/2014 Elsevier Interactive Patient Education  2017 [employer].

## 2016-09-01 ENCOUNTER — Ambulatory Visit: Payer: Self-pay | Admitting: *Deleted

## 2016-09-01 VITALS — BP 128/81 | HR 68 | Ht 61.5 in | Wt 164.0 lb

## 2016-09-01 DIAGNOSIS — Z0000 Encounter for general adult medical examination without abnormal findings: Secondary | ICD-10-CM

## 2016-09-01 NOTE — Progress Notes (Signed)
[employer] insurance premium discount evaluation: Labs Drawn. Replacements ROI form signed. Tobacco Free Attestation form signed.  Forms placed in paper chart.  

## 2016-09-02 NOTE — Progress Notes (Signed)
Results reviewed with pt. Due to [facility] delays, Vit D still pending. Lipid Panel still elevated, slightly improved. A1c remains WNL. Discussed diet and exercise modifications to improve lipids. She verbalizes understanding and agreement. Results routed to pcp per pt request. Copy provided to pt.

## 2016-09-03 LAB — CMP12+LP+TP+TSH+6AC+CBC/D/PLT
ALBUMIN: 4.5 g/dL (ref 3.6–4.8)
ALT: 16 IU/L (ref 0–32)
AST: 18 IU/L (ref 0–40)
Albumin/Globulin Ratio: 2 (ref 1.2–2.2)
Alkaline Phosphatase: 65 IU/L (ref 39–117)
BILIRUBIN TOTAL: 0.4 mg/dL (ref 0.0–1.2)
BUN / CREAT RATIO: 19 (ref 12–28)
BUN: 14 mg/dL (ref 8–27)
Basophils Absolute: 0 10*3/uL (ref 0.0–0.2)
Basos: 0 %
CHLORIDE: 102 mmol/L (ref 96–106)
CHOLESTEROL TOTAL: 246 mg/dL — AB (ref 100–199)
CREATININE: 0.73 mg/dL (ref 0.57–1.00)
Calcium: 9.7 mg/dL (ref 8.7–10.3)
Chol/HDL Ratio: 4 ratio (ref 0.0–4.4)
EOS (ABSOLUTE): 0.1 10*3/uL (ref 0.0–0.4)
EOS: 2 %
Estimated CHD Risk: 0.9 times avg. (ref 0.0–1.0)
FREE THYROXINE INDEX: 1.9 (ref 1.2–4.9)
GFR calc Af Amer: 99 mL/min/{1.73_m2} (ref >59)
GFR, EST NON AFRICAN AMERICAN: 85 mL/min/{1.73_m2} (ref >59)
GGT: 9 IU/L (ref 0–60)
GLUCOSE: 93 mg/dL (ref 65–99)
Globulin, Total: 2.2 g/dL (ref 1.5–4.5)
HDL: 61 mg/dL (ref >39)
HEMOGLOBIN: 13.3 g/dL (ref 11.1–15.9)
Hematocrit: 39.2 % (ref 34.0–46.6)
Immature Grans (Abs): 0 10*3/uL (ref 0.0–0.1)
Immature Granulocytes: 0 %
Iron: 99 ug/dL (ref 27–139)
LDH: 172 IU/L (ref 119–226)
LDL Calculated: 150 mg/dL — ABNORMAL HIGH (ref 0–99)
LYMPHS ABS: 2.7 10*3/uL (ref 0.7–3.1)
Lymphs: 38 %
MCH: 32 pg (ref 26.6–33.0)
MCHC: 33.9 g/dL (ref 31.5–35.7)
MCV: 95 fL (ref 79–97)
MONOS ABS: 0.3 10*3/uL (ref 0.1–0.9)
Monocytes: 5 %
Neutrophils Absolute: 4 10*3/uL (ref 1.4–7.0)
Neutrophils: 55 %
PHOSPHORUS: 3.7 mg/dL (ref 2.5–4.5)
Platelets: 299 10*3/uL (ref 150–379)
Potassium: 4.4 mmol/L (ref 3.5–5.2)
RBC: 4.15 x10E6/uL (ref 3.77–5.28)
RDW: 12.9 % (ref 12.3–15.4)
SODIUM: 141 mmol/L (ref 134–144)
T3 UPTAKE RATIO: 25 % (ref 24–39)
T4 TOTAL: 7.5 ug/dL (ref 4.5–12.0)
TOTAL PROTEIN: 6.7 g/dL (ref 6.0–8.5)
TSH: 2.27 u[IU]/mL (ref 0.450–4.500)
Triglycerides: 175 mg/dL — ABNORMAL HIGH (ref 0–149)
URIC ACID: 4.6 mg/dL (ref 2.5–7.1)
VLDL Cholesterol Cal: 35 mg/dL (ref 5–40)
WBC: 7.2 10*3/uL (ref 3.4–10.8)

## 2016-09-03 LAB — HGB A1C W/O EAG: HEMOGLOBIN A1C: 5.4 % (ref 4.8–5.6)

## 2016-09-03 LAB — VITAMIN D 25 HYDROXY (VIT D DEFICIENCY, FRACTURES): VIT D 25 HYDROXY: 42 ng/mL (ref 30.0–100.0)

## 2017-04-07 LAB — HM DEXA SCAN

## 2017-04-13 LAB — HM MAMMOGRAPHY

## 2017-09-21 IMAGING — CT CT ABD-PELV W/ CM
2 of 5 series · 15 of 46 positions shown, 17 images · IV contrast (ISOVUE 300)
Comparison: None.

CLINICAL DATA: Three-month history of left lower quadrant pain.

EXAM:
CT ABDOMEN AND PELVIS WITH CONTRAST
TECHNIQUE: Multidetector CT imaging of the abdomen and pelvis was performed
using the standard protocol following bolus administration of
intravenous contrast.
CONTRAST:  100mL AYF7N4-GGG IOPAMIDOL (AYF7N4-GGG) INJECTION 61%

[Series 2: abd/ pelvis · axial · 0.83mm/px · z∈[-648,-263]mm · 12 of 89 slices shown, 14 images]
[im 6/89  soft-tissue]
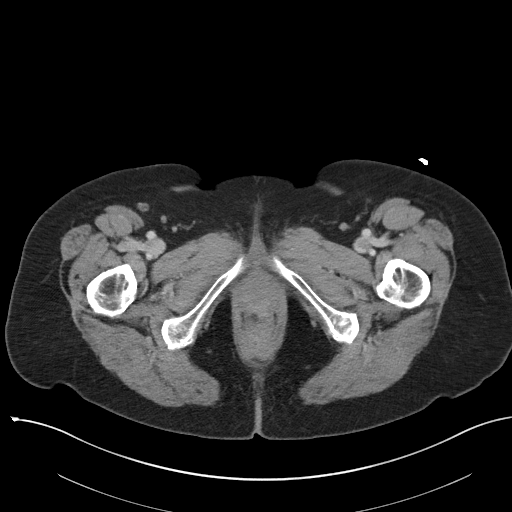
[im 6/89  bone]
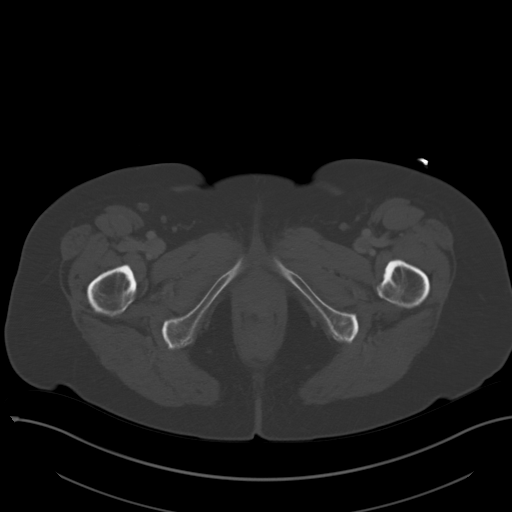
[im 16/89  soft-tissue]
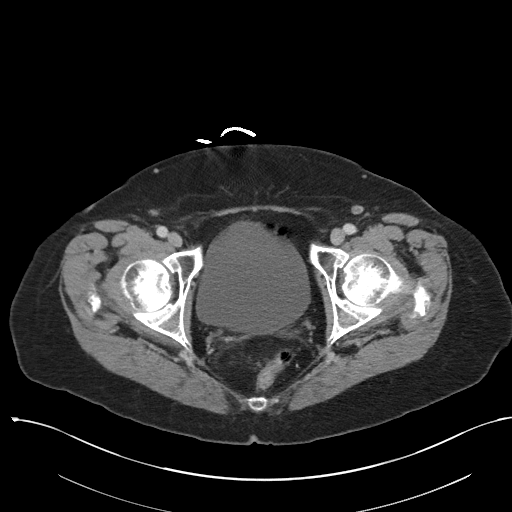
[im 21/89  soft-tissue]
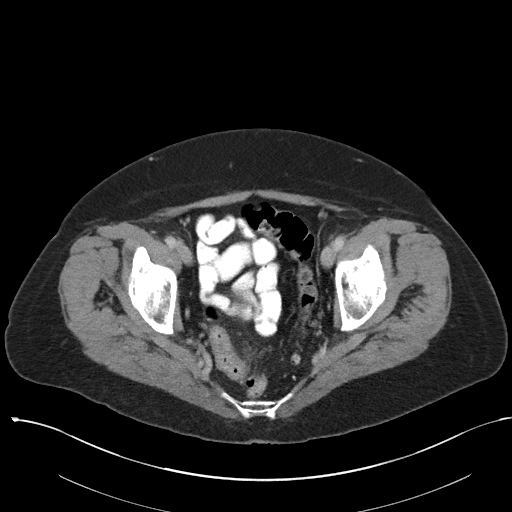
[im 26/89  soft-tissue]
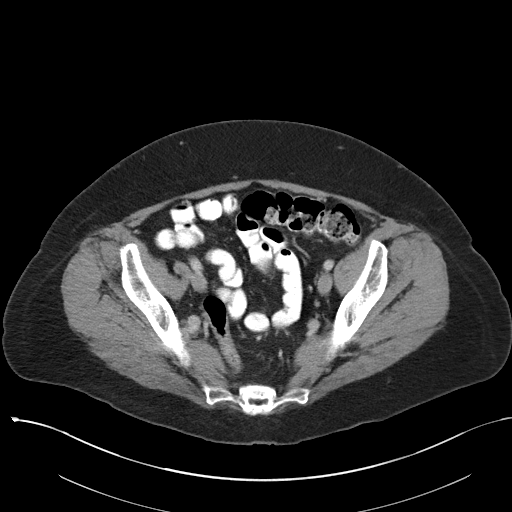
[im 37/89  soft-tissue]
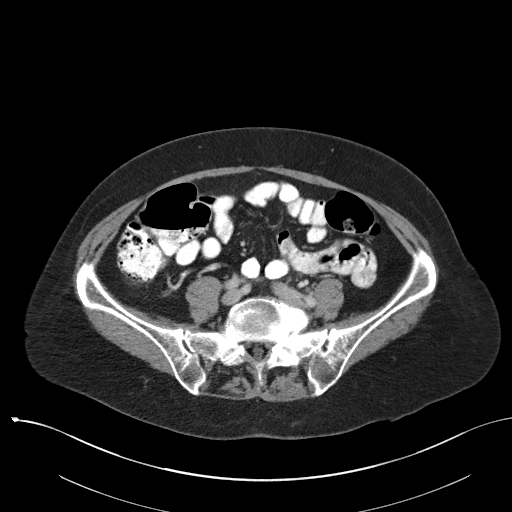
[im 42/89  soft-tissue]
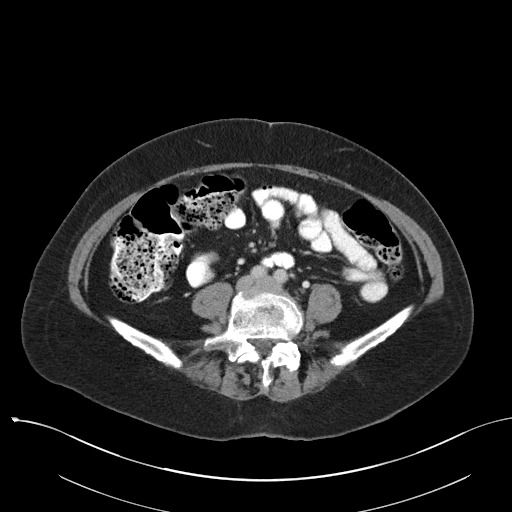
[im 47/89  soft-tissue]
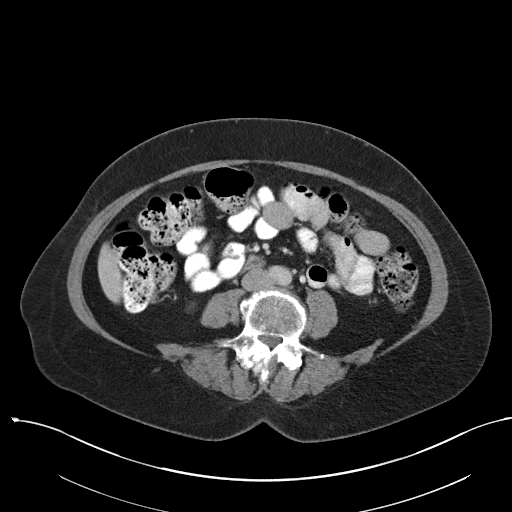
[im 57/89  soft-tissue]
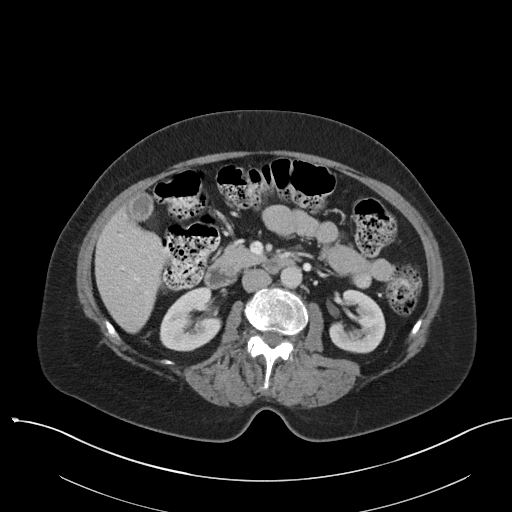
[im 63/89  soft-tissue]
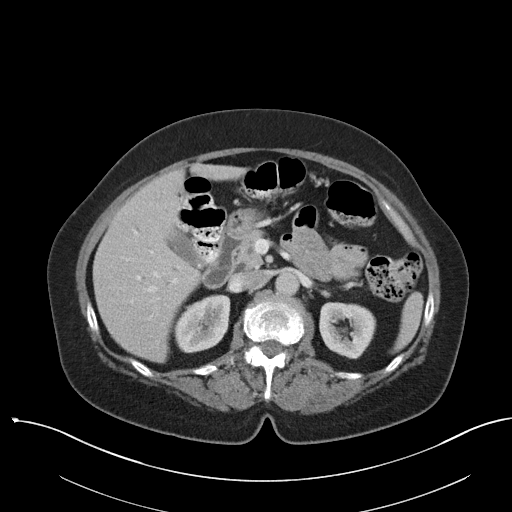
[im 63/89  bone]
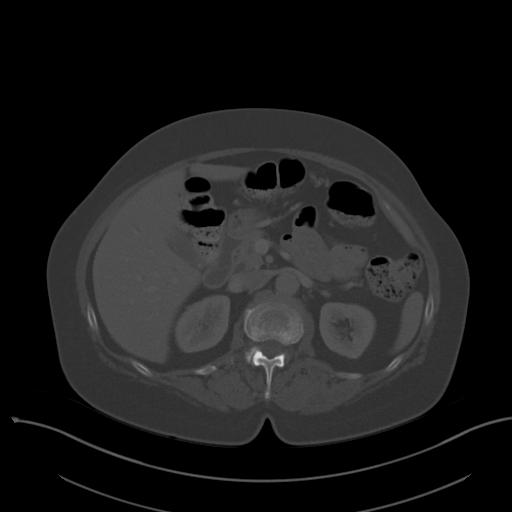
[im 68/89  soft-tissue]
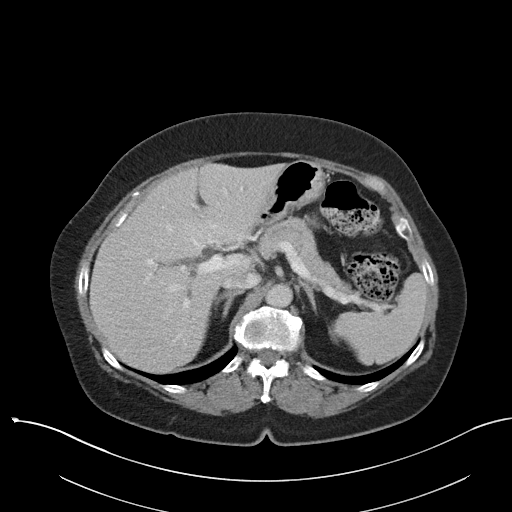
[im 78/89  soft-tissue]
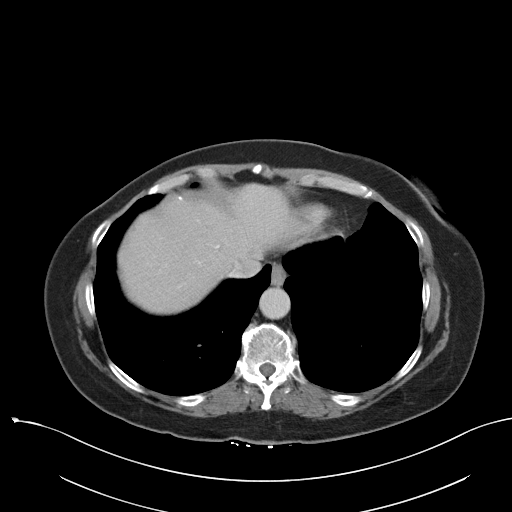
[im 83/89  soft-tissue]
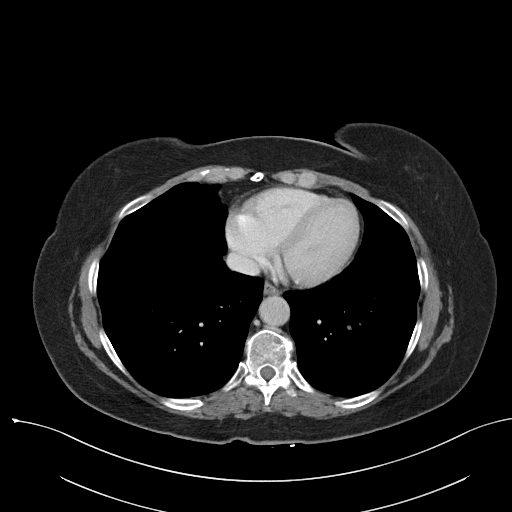

[Series 5: coronal soft tissue · coronal · 0.66mm/px · 3 of 101 slices shown]
[im 34/101  soft-tissue]
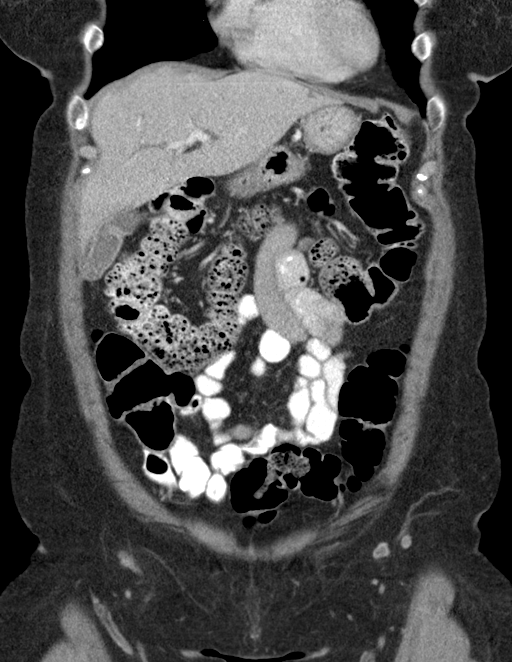
[im 45/101  soft-tissue]
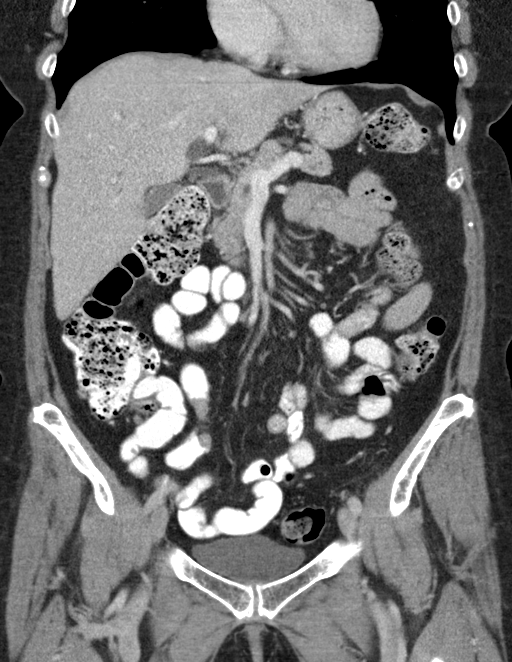
[im 56/101  soft-tissue]
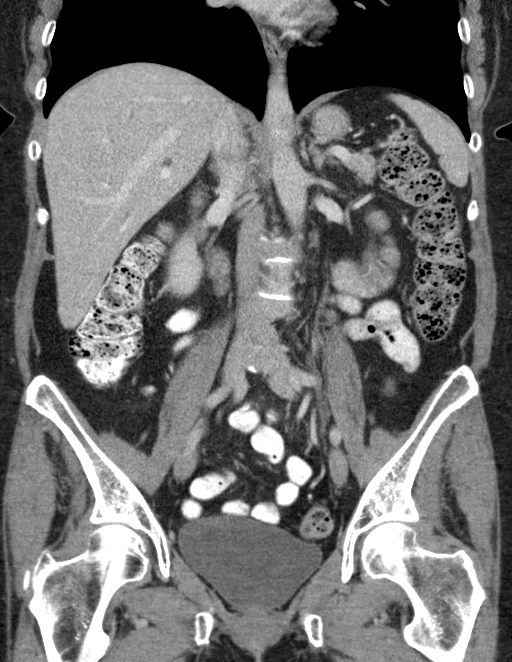

[15 of 46 positions shown; findings below may reference images not displayed]

FINDINGS: Lower chest: Tiny posterior left lower lobe nodule is calcified,
compatible with granuloma.

Hepatobiliary: 8 mm hypodensity in the lateral segment left liver is
likely a tiny cyst. Mild intrahepatic biliary duct dilatation is
noted. Mild prominence of the extrahepatic common duct is evident
although common bile duct in the head of the pancreas is nondilated.
Multiple tiny calcified stones are noted in the fundus and in the
neck of the gallbladder.

Pancreas: No focal mass lesion. No dilatation of the main duct. No
intraparenchymal cyst. No peripancreatic edema.

Spleen: No splenomegaly. No focal mass lesion.

Adrenals/Urinary Tract: No adrenal nodule or mass. 4 mm hypodense
lesion interpolar right kidney too small to characterize but likely
a tiny cyst. 9 mm lesion in the interpolar left kidney can also not
be completely characterized although attenuation is higher than
would be expected for a simple cyst No evidence for hydroureter. The
urinary bladder appears normal for the degree of distention.

Stomach/Bowel: Stomach is nondistended. No gastric wall thickening.
No evidence of outlet obstruction. Duodenum is normally positioned
as is the ligament of Treitz. No small bowel wall thickening. No
small bowel dilatation. The terminal ileum is normal. The appendix
is normal. Diverticular changes are noted in the left colon without
evidence of diverticulitis.

Vascular/Lymphatic: There is abdominal aortic atherosclerosis
without aneurysm. There is no gastrohepatic or hepatoduodenal
ligament lymphadenopathy. No intraperitoneal or retroperitoneal
lymphadenopathy. No pelvic sidewall lymphadenopathy.

Reproductive: Uterus is surgically absent. There is no adnexal mass.

Other: No intraperitoneal free fluid.

Musculoskeletal: Bone windows reveal no worrisome lytic or sclerotic
osseous lesions. Superior endplate compression at L2 is
age-indeterminate.
IMPRESSION: 1. No CT findings to explain the patient's history of left lower
quadrant pain. There is some left colonic diverticulosis, but no CT
features of diverticulitis.
2. Cholelithiasis. There is mild associated intrahepatic biliary
duct dilatation without obstructive etiology by CT imaging.
Correlation with liver function test may prove helpful.
3. 9 mm interpolar left renal lesion with attenuation too high to
represent a simple cyst. This may well be a cyst complicated by
proteinaceous debris or hemorrhage, but MRI without and with
contrast is recommended to confirm.

## 2018-02-26 ENCOUNTER — Ambulatory Visit: Payer: Self-pay | Admitting: *Deleted

## 2018-02-26 VITALS — BP 118/72 | HR 80 | Ht 61.5 in | Wt 157.0 lb

## 2018-02-26 DIAGNOSIS — Z0000 Encounter for general adult medical examination without abnormal findings: Secondary | ICD-10-CM

## 2018-02-26 NOTE — Progress Notes (Signed)
[employer] insurance premium discount evaluation: Labs Drawn. Replacements ROI form signed. Tobacco Free Attestation form signed.  Forms placed in paper chart. Pt is changing pcps due to location. Is unsure who new pcp is. Sts her daughter made the appointment for her. So no one to route results to.

## 2018-02-27 LAB — CMP12+LP+TP+TSH+6AC+CBC/D/PLT
ALT: 11 IU/L (ref 0–32)
AST: 16 IU/L (ref 0–40)
Albumin/Globulin Ratio: 1.6 (ref 1.2–2.2)
Albumin: 4.1 g/dL (ref 3.6–4.8)
Alkaline Phosphatase: 79 IU/L (ref 39–117)
BUN/Creatinine Ratio: 30 — ABNORMAL HIGH (ref 12–28)
BUN: 23 mg/dL (ref 8–27)
Basophils Absolute: 0 10*3/uL (ref 0.0–0.2)
Basos: 0 %
Bilirubin Total: 0.2 mg/dL (ref 0.0–1.2)
CALCIUM: 9.4 mg/dL (ref 8.7–10.3)
CHOL/HDL RATIO: 3.7 ratio (ref 0.0–4.4)
CREATININE: 0.76 mg/dL (ref 0.57–1.00)
Chloride: 103 mmol/L (ref 96–106)
Cholesterol, Total: 241 mg/dL — ABNORMAL HIGH (ref 100–199)
EOS (ABSOLUTE): 0.4 10*3/uL (ref 0.0–0.4)
ESTIMATED CHD RISK: 0.7 times avg. (ref 0.0–1.0)
Eos: 5 %
Free Thyroxine Index: 2 (ref 1.2–4.9)
GFR, EST AFRICAN AMERICAN: 93 mL/min/{1.73_m2} (ref >59)
GFR, EST NON AFRICAN AMERICAN: 80 mL/min/{1.73_m2} (ref >59)
GGT: 10 IU/L (ref 0–60)
GLOBULIN, TOTAL: 2.6 g/dL (ref 1.5–4.5)
Glucose: 89 mg/dL (ref 65–99)
HDL: 65 mg/dL (ref >39)
Hematocrit: 38.4 % (ref 34.0–46.6)
Hemoglobin: 13.5 g/dL (ref 11.1–15.9)
Immature Grans (Abs): 0 10*3/uL (ref 0.0–0.1)
Immature Granulocytes: 0 %
Iron: 73 ug/dL (ref 27–139)
LDH: 185 IU/L (ref 119–226)
LDL Calculated: 149 mg/dL — ABNORMAL HIGH (ref 0–99)
Lymphocytes Absolute: 2.2 10*3/uL (ref 0.7–3.1)
Lymphs: 31 %
MCH: 32.9 pg (ref 26.6–33.0)
MCHC: 35.2 g/dL (ref 31.5–35.7)
MCV: 94 fL (ref 79–97)
MONOS ABS: 0.5 10*3/uL (ref 0.1–0.9)
Monocytes: 6 %
NEUTROS ABS: 4 10*3/uL (ref 1.4–7.0)
Neutrophils: 58 %
PHOSPHORUS: 3.5 mg/dL (ref 2.5–4.5)
POTASSIUM: 4.4 mmol/L (ref 3.5–5.2)
Platelets: 276 10*3/uL (ref 150–450)
RBC: 4.1 x10E6/uL (ref 3.77–5.28)
RDW: 12.2 % (ref 11.7–15.4)
SODIUM: 143 mmol/L (ref 134–144)
T3 Uptake Ratio: 26 % (ref 24–39)
T4 TOTAL: 7.7 ug/dL (ref 4.5–12.0)
TRIGLYCERIDES: 135 mg/dL (ref 0–149)
TSH: 3.22 u[IU]/mL (ref 0.450–4.500)
Total Protein: 6.7 g/dL (ref 6.0–8.5)
URIC ACID: 4.6 mg/dL (ref 2.5–7.1)
VLDL Cholesterol Cal: 27 mg/dL (ref 5–40)
WBC: 7.2 10*3/uL (ref 3.4–10.8)

## 2018-02-27 LAB — VITAMIN D 25 HYDROXY (VIT D DEFICIENCY, FRACTURES): Vit D, 25-Hydroxy: 44.2 ng/mL (ref 30.0–100.0)

## 2018-02-27 LAB — HGB A1C W/O EAG: HEMOGLOBIN A1C: 5.4 % (ref 4.8–5.6)

## 2018-03-02 NOTE — Progress Notes (Signed)
noted 

## 2018-06-28 ENCOUNTER — Telehealth: Payer: Self-pay | Admitting: Family Medicine

## 2018-06-28 NOTE — Telephone Encounter (Signed)
I'd be honored to see her and that day is fine with me.

## 2018-06-28 NOTE — Telephone Encounter (Signed)
Patients daughter who is seeing Dr Dayton Martes called and said she would like to see if Dr Dayton Martes would see her mother as a new patient and if so she wanted to know if she would see her on 10/11/2018 which is the same day the daughter is having her next CPE because she said she needs to be with pt to interpret, I let her know that Dr Dayton Martes isn't seeing new patients right now but she wanted be to ask to be sure.

## 2018-06-29 NOTE — Telephone Encounter (Signed)
AD-Can you please facilitate getting this patients' mother scheduled on 8.24.20 as a New Pt/this is ok with Dr. Philippa Sicks dmf

## 2018-06-29 NOTE — Telephone Encounter (Signed)
Alyssa Mccormick was going to work on this

## 2018-10-07 NOTE — Progress Notes (Addendum)
Subjective:   Patient ID: Alyssa Mccormick, female    DOB: 07/31/1950, 68 y.o.   MRN: [id number]  Alyssa PoissonJelena Bouchard is a pleasant 68 y.o. year old female who presents to clinic today with Establish Care (est care/ CPE- fasting/  denies immunizations)  on 10/11/2018  HPI: Patient is new to me.  Moved here from [place] in 2000. She very active, still working.  Has GYN- [facility].  Had mammogram and Dexa in 2019 per pt.  H/o diverticulitis- last colonoscopy on 10/19/2015- Dr. Marina GoodellPerry- diverticulosis- 10 year recall. Last attack was in 2017.  Health Maintenance  Topic Date Due  . Hepatitis C Screening  1950/03/27  . MAMMOGRAM  02/26/1999  . DEXA SCAN  02/25/2014  . PNA vac Low Risk Adult (1 of 2 - PCV13) 02/25/2014  . INFLUENZA VACCINE  09/18/2018  . TETANUS/TDAP  05/21/2022  . COLONOSCOPY  10/18/2025   Current Outpatient Medications on File Prior to Visit  Medication Sig Dispense Refill  . CALCIUM-VITAMIN D PO Take by mouth.    . Multiple Vitamin (MULTIVITAMIN) tablet Take 1 tablet by mouth daily.    . Omega-3 Fatty Acids (FISH OIL PO) Take 1 capsule by mouth daily.     No current facility-administered medications on file prior to visit.     No Known Allergies  HLD- Lab Results  Component Value Date   CHOL 241 (H) 02/26/2018   HDL 65 02/26/2018   LDLCALC 149 (H) 02/26/2018   TRIG 135 02/26/2018   CHOLHDL 3.7 02/26/2018   The 10-year ASCVD risk score Denman George(Goff DC Jr., et al., 2013) is: 6.8%   Values used to calculate the score:     Age: [age] years     Sex: Female     Is Non-Hispanic African American: No     Diabetic: No     Tobacco smoker: No     Systolic Blood Pressure: 112 mmHg     Is BP treated: No     HDL Cholesterol: 65 mg/dL     Total Cholesterol: 241 mg/dL  Past Medical History:  Diagnosis Date  . Arthritis    hands  . Diverticulosis   . Gall stones     Past Surgical History:  Procedure Laterality Date  . ABDOMINAL HYSTERECTOMY      Family  History  Problem Relation Age of Onset  . Diabetes Mother   . Esophageal cancer Father   . Colon cancer Neg Hx   . Colon polyps Neg Hx   . Rectal cancer Neg Hx   . Stomach cancer Neg Hx     Social History   Socioeconomic History  . Marital status: Widowed    Spouse name: Not on file  . Number of children: Not on file  . Years of education: Not on file  . Highest education level: Not on file  Occupational History  . Not on file  Social Needs  . Financial resource strain: Not on file  . Food insecurity    Worry: Not on file    Inability: Not on file  . Transportation needs    Medical: Not on file    Non-medical: Not on file  Tobacco Use  . Smoking status: Never Smoker  . Smokeless tobacco: Never Used  Substance and Sexual Activity  . Alcohol use: No    Alcohol/week: 0.0 standard drinks  . Drug use: No  . Sexual activity: Not on file  Lifestyle  . Physical activity    Days per  week: Not on file    Minutes per session: Not on file  . Stress: Not on file  Relationships  . Social Herbalist on phone: Not on file    Gets together: Not on file    Attends religious service: Not on file    Active member of club or organization: Not on file    Attends meetings of clubs or organizations: Not on file    Relationship status: Not on file  . Intimate partner violence    Fear of current or ex partner: Not on file    Emotionally abused: Not on file    Physically abused: Not on file    Forced sexual activity: Not on file  Other Topics Concern  . Not on file  Social History Narrative  . Not on file   The PMH, PSH, Social History, Family History, Medications, and allergies have been reviewed in [facility], and have been updated if relevant.   Review of Systems  Constitutional: Negative.   HENT: Negative.   Eyes: Negative.   Respiratory: Negative.   Cardiovascular: Negative.   Gastrointestinal: Negative.   Endocrine: Negative.   Genitourinary: Negative.    Musculoskeletal: Negative.   Skin: Negative.   Allergic/Immunologic: Negative.   Neurological: Negative.   Hematological: Negative.   Psychiatric/Behavioral: Negative.   All other systems reviewed and are negative.      Objective:    BP 112/70   Pulse (!) 58   Temp 98.3 F (36.8 C) (Oral)   Ht 5\' 1"  (1.549 m)   Wt 158 lb 12.8 oz (72 kg)   SpO2 97%   BMI 30.00 kg/m    Physical Exam   General:  Well-developed,well-nourished,in no acute distress; alert,appropriate and cooperative throughout examination Head:  normocephalic and atraumatic.   Eyes:  vision grossly intact, PERRL Ears:  R ear normal and L ear normal externally, TMs clear bilaterally Nose:  no external deformity.   Mouth:  good dentition.   Neck:  No deformities, masses, or tenderness noted.  Lungs:  Normal respiratory effort, chest expands symmetrically. Lungs are clear to auscultation, no crackles or wheezes. Heart:  Normal rate and regular rhythm. S1 and S2 normal without gallop, murmur, click, rub or other extra sounds. Abdomen:  Bowel sounds positive,abdomen soft and non-tender without masses, organomegaly or hernias noted. Msk:  No deformity or scoliosis noted of thoracic or lumbar spine.   Extremities:  No clubbing, cyanosis, edema, or deformity noted with normal full range of motion of all joints.   Neurologic:  alert & oriented X3 and gait normal.   Skin:  Intact without suspicious lesions or rashes Cervical Nodes:  No lymphadenopathy noted Axillary Nodes:  No palpable lymphadenopathy Psych:  Cognition and judgment appear intact. Alert and cooperative with normal attention span and concentration. No apparent delusions, illusions, hallucinations       Assessment & Plan:   Well woman exam without gynecological exam  Hyperlipidemia, unspecified hyperlipidemia type - Plan: Comprehensive metabolic panel, Lipid panel, TSH, CBC with Differential/Platelet  Prediabetes - Plan: Hemoglobin A1c   Osteopenia, unspecified location - Plan: Vitamin D (25 hydroxy)  Calculus of gallbladder without cholecystitis without obstruction  History of diverticulitis No follow-ups on file.

## 2018-10-08 ENCOUNTER — Telehealth: Payer: Self-pay

## 2018-10-08 NOTE — Telephone Encounter (Signed)
Questions for Screening COVID-19  Symptom onset: None  Travel or Contacts: None  During this illness, did/does the patient experience any of the following symptoms? Fever >100.37F []   Yes [x]   No []   Unknown Subjective fever (felt feverish) []   Yes [x]   No []   Unknown Chills []   Yes [x]   No []   Unknown Muscle aches (myalgia) []   Yes [x]   No []   Unknown Runny nose (rhinorrhea) []   Yes [x]   No []   Unknown Sore throat []   Yes [x]   No []   Unknown Cough (new onset or worsening of chronic cough) []   Yes [x]   No []   Unknown Shortness of breath (dyspnea) []   Yes [x]   No []   Unknown Nausea or vomiting []   Yes [x]   No []   Unknown Headache []   Yes [x]   No []   Unknown Abdominal pain  []   Yes [x]   No []   Unknown Diarrhea (?3 loose/looser than normal stools/24hr period) []   Yes []   No []   Unknown Other, specify:  Patient risk factors: Smoker? []   Current []   Former []   Never If female, currently pregnant? []   Yes []   No  Patient Active Problem List   Diagnosis Date Noted  . LLQ abdominal pain 08/03/2015  . Diverticulosis of colon without hemorrhage 08/03/2015  . Osteopenia 12/15/2014  . Abdominal pain 11/28/2014  . Hyperlipidemia 11/28/2014  . Prediabetes 11/28/2014  . History of diverticulitis 11/28/2014    Plan:  []   High risk for COVID-19 with red flags go to ED (with CP, SOB, weak/lightheaded, or fever > 101.5). Call ahead.  []   High risk for COVID-19 but stable. Inform provider and coordinate time for [facility] visit.   []   No red flags but URI signs or symptoms okay for [facility] visit.

## 2018-10-11 ENCOUNTER — Encounter: Payer: Self-pay | Admitting: Family Medicine

## 2018-10-11 ENCOUNTER — Ambulatory Visit (INDEPENDENT_AMBULATORY_CARE_PROVIDER_SITE_OTHER): Payer: PRIVATE HEALTH INSURANCE | Admitting: Family Medicine

## 2018-10-11 ENCOUNTER — Other Ambulatory Visit: Payer: Self-pay

## 2018-10-11 VITALS — BP 112/70 | HR 58 | Temp 98.3°F | Ht 61.0 in | Wt 158.8 lb

## 2018-10-11 DIAGNOSIS — R7303 Prediabetes: Secondary | ICD-10-CM | POA: Diagnosis not present

## 2018-10-11 DIAGNOSIS — M8580 Other specified disorders of bone density and structure, unspecified site: Secondary | ICD-10-CM

## 2018-10-11 DIAGNOSIS — E785 Hyperlipidemia, unspecified: Secondary | ICD-10-CM | POA: Diagnosis not present

## 2018-10-11 DIAGNOSIS — Z0000 Encounter for general adult medical examination without abnormal findings: Secondary | ICD-10-CM | POA: Diagnosis not present

## 2018-10-11 DIAGNOSIS — K8020 Calculus of gallbladder without cholecystitis without obstruction: Secondary | ICD-10-CM

## 2018-10-11 DIAGNOSIS — Z131 Encounter for screening for diabetes mellitus: Secondary | ICD-10-CM | POA: Insufficient documentation

## 2018-10-11 DIAGNOSIS — Z8719 Personal history of other diseases of the digestive system: Secondary | ICD-10-CM

## 2018-10-11 LAB — COMPREHENSIVE METABOLIC PANEL
ALT: 12 U/L (ref 0–35)
AST: 17 U/L (ref 0–37)
Albumin: 4 g/dL (ref 3.5–5.2)
Alkaline Phosphatase: 69 U/L (ref 39–117)
BUN: 11 mg/dL (ref 6–23)
CO2: 31 mEq/L (ref 19–32)
Calcium: 9 mg/dL (ref 8.4–10.5)
Chloride: 101 mEq/L (ref 96–112)
Creatinine, Ser: 0.63 mg/dL (ref 0.40–1.20)
GFR: 93.52 mL/min (ref >60.00)
Glucose, Bld: 92 mg/dL (ref 70–99)
Potassium: 4.2 mEq/L (ref 3.5–5.1)
Sodium: 138 mEq/L (ref 135–145)
Total Bilirubin: 0.4 mg/dL (ref 0.2–1.2)
Total Protein: 6.7 g/dL (ref 6.0–8.3)

## 2018-10-11 LAB — CBC WITH DIFFERENTIAL/PLATELET
Basophils Absolute: 0 10*3/uL (ref 0.0–0.1)
Basophils Relative: 0.4 % (ref 0.0–3.0)
Eosinophils Absolute: 0.3 10*3/uL (ref 0.0–0.7)
Eosinophils Relative: 4.3 % (ref 0.0–5.0)
HCT: 38.4 % (ref 36.0–46.0)
Hemoglobin: 13 g/dL (ref 12.0–15.0)
Lymphocytes Relative: 35.2 % (ref 12.0–46.0)
Lymphs Abs: 2.1 10*3/uL (ref 0.7–4.0)
MCHC: 34 g/dL (ref 30.0–36.0)
MCV: 94.9 fl (ref 78.0–100.0)
Monocytes Absolute: 0.3 10*3/uL (ref 0.1–1.0)
Monocytes Relative: 5.8 % (ref 3.0–12.0)
Neutro Abs: 3.2 10*3/uL (ref 1.4–7.7)
Neutrophils Relative %: 54.3 % (ref 43.0–77.0)
Platelets: 247 10*3/uL (ref 150.0–400.0)
RBC: 4.04 Mil/uL (ref 3.87–5.11)
RDW: 11.8 % (ref 11.5–15.5)
WBC: 5.8 10*3/uL (ref 4.0–10.5)

## 2018-10-11 LAB — HEMOGLOBIN A1C: Hgb A1c MFr Bld: 5.7 % (ref 4.6–6.5)

## 2018-10-11 LAB — LIPID PANEL
Cholesterol: 224 mg/dL — ABNORMAL HIGH (ref 0–200)
HDL: 50.4 mg/dL (ref >39.00)
LDL Cholesterol: 142 mg/dL — ABNORMAL HIGH (ref 0–99)
NonHDL: 173.68
Total CHOL/HDL Ratio: 4
Triglycerides: 160 mg/dL — ABNORMAL HIGH (ref 0.0–149.0)
VLDL: 32 mg/dL (ref 0.0–40.0)

## 2018-10-11 NOTE — Assessment & Plan Note (Signed)
Check a1c today

## 2018-10-11 NOTE — Assessment & Plan Note (Signed)
Pretty low asvd score. Check labs today.

## 2018-10-11 NOTE — Assessment & Plan Note (Signed)
Reviewed preventive care protocols, scheduled due services, and updated immunizations Discussed nutrition, exercise, diet, and healthy lifestyle.  

## 2018-10-11 NOTE — Assessment & Plan Note (Signed)
UTD with Dr. Helane Rima.

## 2018-10-11 NOTE — Assessment & Plan Note (Signed)
No recent flares 

## 2018-10-11 NOTE — Patient Instructions (Signed)
Great to meet you.  I will call you with your lab results from today and you can view them online.   

## 2018-10-11 NOTE — Assessment & Plan Note (Signed)
Now asymptomatic. Still has gallbladder.

## 2018-10-12 ENCOUNTER — Encounter: Payer: Self-pay | Admitting: Family Medicine

## 2018-10-12 LAB — VITAMIN D 25 HYDROXY (VIT D DEFICIENCY, FRACTURES): VITD: 77.95 ng/mL (ref 30.00–100.00)

## 2018-10-12 LAB — TSH: TSH: 2.61 u[IU]/mL (ref 0.35–4.50)

## 2019-05-20 ENCOUNTER — Emergency Department (HOSPITAL_COMMUNITY): Payer: No Typology Code available for payment source

## 2019-05-20 ENCOUNTER — Other Ambulatory Visit: Payer: Self-pay

## 2019-05-20 ENCOUNTER — Encounter (HOSPITAL_COMMUNITY): Payer: Self-pay | Admitting: Emergency Medicine

## 2019-05-20 ENCOUNTER — Emergency Department (HOSPITAL_COMMUNITY)
Admission: EM | Admit: 2019-05-20 | Discharge: 2019-05-20 | Disposition: A | Discharge status: Home / Self Care | Payer: No Typology Code available for payment source | Attending: Emergency Medicine | Admitting: Emergency Medicine

## 2019-05-20 DIAGNOSIS — R05 Cough: Secondary | ICD-10-CM | POA: Diagnosis present

## 2019-05-20 DIAGNOSIS — M546 Pain in thoracic spine: Secondary | ICD-10-CM | POA: Diagnosis not present

## 2019-05-20 DIAGNOSIS — R059 Cough, unspecified: Secondary | ICD-10-CM

## 2019-05-20 LAB — CBC
HCT: 38.6 % (ref 36.0–46.0)
Hemoglobin: 12.7 g/dL (ref 12.0–15.0)
MCH: 32.3 pg (ref 26.0–34.0)
MCHC: 32.9 g/dL (ref 30.0–36.0)
MCV: 98.2 fL (ref 80.0–100.0)
Platelets: 278 10*3/uL (ref 150–400)
RBC: 3.93 MIL/uL (ref 3.87–5.11)
RDW: 11.7 % (ref 11.5–15.5)
WBC: 7 10*3/uL (ref 4.0–10.5)
nRBC: 0 % (ref 0.0–0.2)

## 2019-05-20 LAB — BASIC METABOLIC PANEL
Anion gap: 9 (ref 5–15)
BUN: 12 mg/dL (ref 8–23)
CO2: 29 mmol/L (ref 22–32)
Calcium: 9.3 mg/dL (ref 8.9–10.3)
Chloride: 101 mmol/L (ref 98–111)
Creatinine, Ser: 0.65 mg/dL (ref 0.44–1.00)
GFR calc Af Amer: >60 mL/min (ref >60)
GFR calc non Af Amer: >60 mL/min (ref >60)
Glucose, Bld: 125 mg/dL — ABNORMAL HIGH (ref 70–99)
Potassium: 4 mmol/L (ref 3.5–5.1)
Sodium: 139 mmol/L (ref 135–145)

## 2019-05-20 LAB — TROPONIN I (HIGH SENSITIVITY): Troponin I (High Sensitivity): 5 ng/L (ref <18)

## 2019-05-20 MED ORDER — BENZONATATE 100 MG PO CAPS: 100.0000 mg | ORAL_CAPSULE | Freq: Three times a day (TID) | ORAL | 0 refills | Status: DC | PRN

## 2019-05-20 MED ORDER — SODIUM CHLORIDE 0.9% FLUSH: 3.0000 mL | Freq: Once | INTRAVENOUS | Status: DC

## 2019-05-20 NOTE — ED Triage Notes (Signed)
Pt coming from Urgent care. Pt speaks [place]. Pt presented to urgent care for upper left back pain with dry cough that started this past week. No fevers. From urgent care, sent over for abnormal EKG. Recent travel from [place] last week. No cardiac history.

## 2019-05-20 NOTE — Discharge Instructions (Signed)
You can take zyrtec, available over the counter once daily for allergies/cough.

## 2019-05-20 NOTE — ED Provider Notes (Signed)
[facility] EMERGENCY DEPARTMENT Provider Note   CSN: [id number] Arrival date & time: 05/20/19  1800     History Chief Complaint  Patient presents with  . Cough  . Abnormal ECG    Sheranda Loyd is a 69 y.o. female.  The history is provided by the patient and a relative. No language interpreter was used.   Iram Poch is a 69 y.o. female who presents to the Emergency Department complaining of cough.  She presents to the ED accompanied by her daughter for evaluation of cough since Tuesday. She reports cough productive of yellow sputum with nasal congestion, scratchy/itchy throat.  She also reports that she has been experiencing some left sided thoracic back pain is worse with laying and moving. This began last night. No reports of fevers, loss of taste or smell, shortness of breath, abdominal pain, nausea, vomiting, leg swelling or pain. She has no medical problems and takes no medications. No personal or family history of DVT or PE. She does not smoke, drink or use alcohol. She was seen in urgent care today and had an EKG performed which showed some nonspecific changes that she was referred to the emergency department for further evaluation.    Past Medical History:  Diagnosis Date  . Arthritis    hands  . Diverticulosis   . Gall stones     Patient Active Problem List   Diagnosis Date Noted  . Cholelithiasis 10/11/2018  . Well woman exam without gynecological exam 10/11/2018  . Diverticulosis of colon without hemorrhage 08/03/2015  . Osteopenia 12/15/2014  . Hyperlipidemia 11/28/2014  . Prediabetes 11/28/2014  . History of diverticulitis 11/28/2014    Past Surgical History:  Procedure Laterality Date  . ABDOMINAL HYSTERECTOMY       OB History   No obstetric history on file.     Family History  Problem Relation Age of Onset  . Diabetes Mother   . Esophageal cancer Father   . Colon cancer Neg Hx   . Colon polyps Neg Hx   . Rectal cancer  Neg Hx   . Stomach cancer Neg Hx     Social History   Tobacco Use  . Smoking status: Never Smoker  . Smokeless tobacco: Never Used  Substance Use Topics  . Alcohol use: No    Alcohol/week: 0.0 standard drinks  . Drug use: No    Home Medications Prior to Admission medications   Medication Sig Start Date End Date Taking? Authorizing Provider  Calcium Carb-Cholecalciferol (CALCIUM 600-D PO) Take 1 tablet by mouth in the morning and at bedtime.   Yes [provider]  Cholecalciferol (VITAMIN D3 PO) Take 1 tablet by mouth daily.   Yes [provider]  ibuprofen (ADVIL) 200 MG tablet Take 400 mg by mouth every 6 (six) hours as needed for headache (pain).   Yes [provider]  Multiple Vitamin (MULTIVITAMIN WITH MINERALS) TABS tablet Take 1 tablet by mouth daily.   Yes [provider]  OVER THE COUNTER MEDICATION Apply 1 application topically in the morning and at bedtime. Over the counter arthritis pain cream   Yes [provider]  OVER THE COUNTER MEDICATION Take 1 tablet by mouth daily. Over the counter allergy medication   Yes [provider]  benzonatate (TESSALON) 100 MG capsule Take 1 capsule (100 mg total) by mouth 3 (three) times daily as needed for cough. 05/20/19   Tilden Fossa, MD    Allergies    Fish allergy  Review of Systems   Review of Systems  All other systems reviewed and are negative.   Physical Exam Updated Vital Signs BP 139/70   Pulse 68   Temp 98.4 F (36.9 C) (Oral)   Resp 16   Ht 5\' 1"  (1.549 m)   Wt 69.4 kg   SpO2 99%   BMI 28.91 kg/m   Physical Exam Vitals and nursing note reviewed.  Constitutional:      Appearance: She is well-developed.  HENT:     Head: Normocephalic and atraumatic.  Cardiovascular:     Rate and Rhythm: Normal rate and regular rhythm.     Heart sounds: No murmur.  Pulmonary:     Effort: Pulmonary effort is normal. No respiratory distress.     Breath sounds: Normal  breath sounds.     Comments: Tenderness to palpation over the left posterior thorax without any overlying rashes Abdominal:     Palpations: Abdomen is soft.     Tenderness: There is no abdominal tenderness. There is no guarding or rebound.  Musculoskeletal:        General: No tenderness.  Skin:    General: Skin is warm and dry.  Neurological:     Mental Status: She is alert and oriented to person, place, and time.  Psychiatric:        Behavior: Behavior normal.     ED Results / Procedures / Treatments   Labs (all labs ordered are listed, but only abnormal results are displayed) Labs Reviewed  BASIC METABOLIC PANEL - Abnormal; Notable for the following components:      Result Value   Glucose, Bld 125 (*)    All other components within normal limits  CBC  TROPONIN I (HIGH SENSITIVITY)    EKG EKG Interpretation  Date/Time:  Friday May 20 2019 18:45:46 EDT Ventricular Rate:  79 PR Interval:  182 QRS Duration: 76 QT Interval:  368 QTC Calculation: 421 R Axis:   9 Text Interpretation: Normal sinus rhythm Cannot rule out Anterior infarct , age undetermined Abnormal ECG no prior available for comparison Confirmed by 03-02-1983 [id number]) on 05/20/2019 7:05:58 PM   Radiology DG Chest 2 View  Result Date: 05/20/2019 CLINICAL DATA:  Cough.  Left upper back pain. EXAM: CHEST - 2 VIEW COMPARISON:  Chest x-ray dated 11/08/2014 FINDINGS: The heart size and mediastinal contours are within normal limits. Both lungs are clear. The visualized skeletal structures are unremarkable. IMPRESSION: No active cardiopulmonary disease. Electronically Signed   By: 11/10/2014 M.D.   On: 05/20/2019 19:09    Procedures Procedures (including critical care time)  Medications Ordered in ED Medications  sodium chloride flush (NS) 0.9 % injection 3 mL (has no administration in time range)    ED Course  I have reviewed the triage vital signs and the nursing notes.  Pertinent labs &  imaging results that were available during my care of the patient were reviewed by me and considered in my medical decision making (see chart for details).    MDM Rules/Calculators/A&P                     Patient here for evaluation of cough, left thoracic pain and abnormal EKG from urgent care. Patient and daughter decline formal hospital interpretation services. Daughter acts as 07/20/2019. EKG here with nonspecific changes, no priors available for comparison. Patient has a mild cough on examination but no respiratory distress. Presentation is not consistent with pneumonia, ACS, PE, dissection, acute CHF.  Discussed with patient and daughter recommendation for Delta troponin and they declined at this time. Discussed home care for musculoskeletal back pain. Discussed importance of outpatient follow-up as well as return precautions. Patient did have a COVID swab sent from urgent care. Results are not yet back.  Aleka Mchargue was evaluated in Emergency Department on 05/20/2019 for the symptoms described in the history of present illness. She was evaluated in the context of the global COVID-19 pandemic, which necessitated consideration that the patient might be at risk for infection with the SARS-CoV-2 virus that causes COVID-19. Institutional protocols and algorithms that pertain to the evaluation of patients at risk for COVID-19 are in a state of rapid change based on information released by regulatory bodies including the CDC and federal and state organizations. These policies and algorithms were followed during the patient's care in the ED.   Final Clinical Impression(s) / ED Diagnoses Final diagnoses:  Cough  Acute left-sided thoracic back pain    Rx / DC Orders ED Discharge Orders         Ordered    benzonatate (TESSALON) 100 MG capsule  3 times daily PRN     05/20/19 1959           Quintella Reichert, MD 05/20/19 2006

## 2019-05-20 NOTE — ED Notes (Signed)
Patient verbalizes understanding of discharge instructions. Opportunity for questioning and answers were provided. Armband removed by staff, pt discharged from ED ambulatory.   

## 2019-05-23 ENCOUNTER — Encounter: Payer: Self-pay | Admitting: *Deleted

## 2019-05-23 ENCOUNTER — Telehealth: Payer: Self-pay | Admitting: *Deleted

## 2019-05-23 NOTE — Telephone Encounter (Signed)
RN notified by HR manager Salley Slaughter of pt being seen in ER on 05/20/19 for cough. She went to [facility] originally (not on Epic, unable to view results) and had Covid swab done there, was referred to ER for abnormal EKG. ER Dx'd with cough, Rx'd tessalon pearles, d/c home. Daughter reported that pt received negative Covid results over the weekend. Her sx had started 3/30, tested on Day 4.   Spoke with pt's daughter over phone (pt's request as ESL). Daughter reported pt is still coughing, started Mucinex yesterday in addition to tessalon pearles. No new sx. Afebrile. When in ED, everyone there was wearing masks and they were able to maintain social distancing majority of the time. Labs and chest xray WNL. Pt did endorse upper back pain in the ER, which MD Dx'd as musculoskeletal r/t cough.   After speaking with HR Tim and he in agreement, advised daughter that based on sx start, negative covid test, pt okay to return to work tomorrow 4/6 while continuing to monitor for new or worsening sx and contact clinic if these occur. Daughter reports that pt rarely complains and feels as though pt may need additional time out of work even if not Covid related. Advised daughter that RN will contact her again tomorrow morning for sx check-in and plan can be adjusted if needed. She is agreeable to this and has no further questions/concerns.

## 2019-05-23 NOTE — Telephone Encounter (Signed)
Noted reviewed Epic ED [facility] encounter note and results and RN Note will follow up with patient tomorrow.

## 2019-05-24 ENCOUNTER — Telehealth: Payer: Self-pay | Admitting: Registered Nurse

## 2019-05-24 MED ORDER — AMOXICILLIN-POT CLAVULANATE 875-125 MG PO TABS: 1.0000 | ORAL_TABLET | Freq: Two times a day (BID) | ORAL | 0 refills | Status: DC

## 2019-05-24 MED ORDER — ALBUTEROL SULFATE HFA 108 (90 BASE) MCG/ACT IN AERS: 1.0000 | INHALATION_SPRAY | Freq: Four times a day (QID) | RESPIRATORY_TRACT | 0 refills | Status: DC | PRN

## 2019-05-24 MED ORDER — PREDNISONE 10 MG (21) PO TBPK: ORAL_TABLET | ORAL | 0 refills | Status: DC

## 2019-05-24 NOTE — Telephone Encounter (Signed)
Patient daughter called she is still coughing and having some dizzyness after coughing spells.  Mucous white thick not see through.  Taking mucinex in the mornings.  Doesn't have inhaler any longer from [facility] cough visit a couple years ago.  Denied wheezing or fever but is taking ibuprofen for muscle pain.  Denied post nasal drip.  Voice sounding congested.  On antihistamine otc per ER provider daily.  Tessalon pearles not helping much.  RN Rolly Salter spoke with patient's daughter then put her on speaker phone with both of [place] in clinic.  Denied fever/chills.  Still having scapular pain ibuprofen helping.  Preferred pharmacy [facility] [address].  Electronic rx called in for prednisone 10mg  po taper (60/50/40/30/20/10) #21 RF0, augmentin 875mg  po BID x 10 days if no relief with 48 hours of prednisone and albuterol inhaler 1-2 puffs po q4-6 h prn protracted cough/wheezing/chest tightness #6.7g RF0.   Covering for bronchitis and pneumonia.   Continue full 10 day covid quarantine and repeat covid testing day 8 or 9.  RN 01-06-1982 verbalized understanding information/instructions, agreed with plan of care and had no further questions at this time.

## 2019-05-26 NOTE — Telephone Encounter (Signed)
RN spoke with pt's daughter Alyssa Mccormick. She reports pt is feeling much better already. Day 2 of prednisone today. Also taking Augmentin. Cough still present, worse when lying down at night, improved during the day. Still taking Mucinex and Ibuprofen as well. Daughter reports pt's congestion is much improved and she sounds less froggy/congested.   Covid testing appt tomorrow 4/9 at 1630 at [facility]. Will plan to f/u with pt over the weekend or Monday. Daughter verbalizes understanding for pt not to RTW until cleared by clinic. She denies any further questions/concerns.

## 2019-05-26 NOTE — Telephone Encounter (Signed)
Noted symptoms improving with plan of care.  Testing scheduled for tomorrow for covid at [facility].

## 2019-05-30 NOTE — Telephone Encounter (Signed)
See 05/24/19 encounter for additional details.

## 2019-05-30 NOTE — Telephone Encounter (Signed)
Pt's daughter forwarded [facility] covid test result to RN and HR Salley Slaughter. Negative result. She reports pt is still coughing but feels well enough to RTW tomorrow. Denies any new or worsening sx. Day 1 3/31, Day 10 4/9, returning 4/13.

## 2019-05-31 NOTE — Telephone Encounter (Signed)
Noted agreed with plan of care 

## 2019-06-03 NOTE — Telephone Encounter (Signed)
[place] RTW as expected.

## 2019-06-03 NOTE — Telephone Encounter (Signed)
Noted returned to work as expected

## 2019-06-03 NOTE — Telephone Encounter (Signed)
Please verify patient returned to work as expected 

## 2019-07-05 ENCOUNTER — Emergency Department (HOSPITAL_COMMUNITY)
Admission: EM | Admit: 2019-07-05 | Discharge: 2019-07-05 | Disposition: A | Discharge status: Home / Self Care | Payer: No Typology Code available for payment source | Attending: Emergency Medicine | Admitting: Emergency Medicine

## 2019-07-05 ENCOUNTER — Encounter (HOSPITAL_COMMUNITY): Payer: Self-pay | Admitting: Student

## 2019-07-05 DIAGNOSIS — R202 Paresthesia of skin: Secondary | ICD-10-CM | POA: Insufficient documentation

## 2019-07-05 DIAGNOSIS — W5911XA Bitten by nonvenomous snake, initial encounter: Secondary | ICD-10-CM

## 2019-07-05 DIAGNOSIS — Y93K9 Activity, other involving animal care: Secondary | ICD-10-CM | POA: Diagnosis not present

## 2019-07-05 DIAGNOSIS — Y999 Unspecified external cause status: Secondary | ICD-10-CM | POA: Insufficient documentation

## 2019-07-05 DIAGNOSIS — Y9272 Chicken coop as the place of occurrence of the external cause: Secondary | ICD-10-CM | POA: Insufficient documentation

## 2019-07-05 DIAGNOSIS — S0103XA Puncture wound without foreign body of scalp, initial encounter: Secondary | ICD-10-CM | POA: Insufficient documentation

## 2019-07-05 DIAGNOSIS — Z79899 Other long term (current) drug therapy: Secondary | ICD-10-CM | POA: Insufficient documentation

## 2019-07-05 LAB — PROTIME-INR
INR: 1 (ref 0.8–1.2)
INR: 1 (ref 0.8–1.2)
Prothrombin Time: 12.4 seconds (ref 11.4–15.2)
Prothrombin Time: 12.5 seconds (ref 11.4–15.2)

## 2019-07-05 LAB — CBC WITH DIFFERENTIAL/PLATELET
Abs Immature Granulocytes: 0.01 10*3/uL (ref 0.00–0.07)
Abs Immature Granulocytes: 0.02 10*3/uL (ref 0.00–0.07)
Basophils Absolute: 0 10*3/uL (ref 0.0–0.1)
Basophils Absolute: 0 10*3/uL (ref 0.0–0.1)
Basophils Relative: 0 %
Basophils Relative: 0 %
Eosinophils Absolute: 0.2 10*3/uL (ref 0.0–0.5)
Eosinophils Absolute: 0.2 10*3/uL (ref 0.0–0.5)
Eosinophils Relative: 3 %
Eosinophils Relative: 2 %
HCT: 42.2 % (ref 36.0–46.0)
HCT: 40.3 % (ref 36.0–46.0)
Hemoglobin: 14.1 g/dL (ref 12.0–15.0)
Hemoglobin: 13.3 g/dL (ref 12.0–15.0)
Immature Granulocytes: 0 %
Immature Granulocytes: 0 %
Lymphocytes Relative: 28 %
Lymphocytes Relative: 36 %
Lymphs Abs: 2.1 10*3/uL (ref 0.7–4.0)
Lymphs Abs: 2.5 10*3/uL (ref 0.7–4.0)
MCH: 32.4 pg (ref 26.0–34.0)
MCH: 31.8 pg (ref 26.0–34.0)
MCHC: 33.4 g/dL (ref 30.0–36.0)
MCHC: 33 g/dL (ref 30.0–36.0)
MCV: 97 fL (ref 80.0–100.0)
MCV: 96.4 fL (ref 80.0–100.0)
Monocytes Absolute: 0.5 10*3/uL (ref 0.1–1.0)
Monocytes Absolute: 0.4 10*3/uL (ref 0.1–1.0)
Monocytes Relative: 6 %
Monocytes Relative: 5 %
Neutro Abs: 4.7 10*3/uL (ref 1.7–7.7)
Neutro Abs: 3.9 10*3/uL (ref 1.7–7.7)
Neutrophils Relative %: 63 %
Neutrophils Relative %: 57 %
Platelets: 265 10*3/uL (ref 150–400)
Platelets: 253 10*3/uL (ref 150–400)
RBC: 4.35 MIL/uL (ref 3.87–5.11)
RBC: 4.18 MIL/uL (ref 3.87–5.11)
RDW: 11.7 % (ref 11.5–15.5)
RDW: 11.8 % (ref 11.5–15.5)
WBC: 7.5 10*3/uL (ref 4.0–10.5)
WBC: 7 10*3/uL (ref 4.0–10.5)
nRBC: 0 % (ref 0.0–0.2)
nRBC: 0 % (ref 0.0–0.2)

## 2019-07-05 LAB — FIBRINOGEN
Fibrinogen: 478 mg/dL — ABNORMAL HIGH (ref 210–475)
Fibrinogen: 403 mg/dL (ref 210–475)

## 2019-07-05 LAB — BASIC METABOLIC PANEL
Anion gap: 12 (ref 5–15)
BUN: 14 mg/dL (ref 8–23)
CO2: 26 mmol/L (ref 22–32)
Calcium: 9.3 mg/dL (ref 8.9–10.3)
Chloride: 100 mmol/L (ref 98–111)
Creatinine, Ser: 0.58 mg/dL (ref 0.44–1.00)
GFR calc Af Amer: >60 mL/min (ref >60)
GFR calc non Af Amer: >60 mL/min (ref >60)
Glucose, Bld: 109 mg/dL — ABNORMAL HIGH (ref 70–99)
Potassium: 4.1 mmol/L (ref 3.5–5.1)
Sodium: 138 mmol/L (ref 135–145)

## 2019-07-05 MED ORDER — DIPHENHYDRAMINE HCL 25 MG PO CAPS
25.0000 mg | ORAL_CAPSULE | Freq: Once | ORAL | Status: AC
  Administered 2019-07-05: 25 mg via ORAL
  Filled 2019-07-05: qty 1

## 2019-07-05 NOTE — ED Notes (Signed)
Recommended obs time 8 hours from time of bite per Poison Control

## 2019-07-05 NOTE — ED Notes (Signed)
Per poison control: No ice No steroids/abx Check hourly X3 hours for sensation PT INR FIBRINOgin CBC at 6 hour mark tetanus

## 2019-07-05 NOTE — ED Notes (Signed)
Pt continues to have no swelling to site. No complaints at this time. Patient Alert and oriented to baseline. Stable and ambulatory to baseline. Patient verbalized understanding of the discharge instructions.  Patient belongings were taken by the patient.

## 2019-07-05 NOTE — ED Provider Notes (Signed)
[facility] EMERGENCY DEPARTMENT Provider Note   CSN: [id number] Arrival date & time: 07/05/19  1005     History Chief Complaint  Patient presents with  . Snake Bite    Alyssa Mccormick is a 69 y.o. female with a history of hyperlipidemia, prediabetes, & diverticulosis who presents to the ED for evaluation s/p snake bite at 0900AM today. Patient states that she was walking out of the chicken coup when a snake fell down on her head as she opened the door. The snake bit her on the R scalp area. She states the area is pruritic currently, was initially painful & erythematous as well but these sxs seemed to resolve. She currently has some chills and "heaviness" sensation in her hands and also states her mouth/throat do not feel quite right- she states "it feels like when you are having an allergic reaction, but not that extreme." No alleviating/aggravating factors. Denies fever, facial swelling, dyspnea, chest pain, N/V/D, abdominal pain, headache, dizziness, syncope, or rash.   Her daughter acted as Nurse, learning disability at [employer] request.   HPI     Past Medical History:  Diagnosis Date  . Arthritis    hands  . Diverticulosis   . Gall stones     Patient Active Problem List   Diagnosis Date Noted  . Cholelithiasis 10/11/2018  . Well woman exam without gynecological exam 10/11/2018  . Diverticulosis of colon without hemorrhage 08/03/2015  . Osteopenia 12/15/2014  . Hyperlipidemia 11/28/2014  . Prediabetes 11/28/2014  . History of diverticulitis 11/28/2014    Past Surgical History:  Procedure Laterality Date  . ABDOMINAL HYSTERECTOMY       OB History   No obstetric history on file.     Family History  Problem Relation Age of Onset  . Diabetes Mother   . Esophageal cancer Father   . Colon cancer Neg Hx   . Colon polyps Neg Hx   . Rectal cancer Neg Hx   . Stomach cancer Neg Hx     Social History   Tobacco Use  . Smoking status: Never Smoker  .  Smokeless tobacco: Never Used  Substance Use Topics  . Alcohol use: No    Alcohol/week: 0.0 standard drinks  . Drug use: No    Home Medications Prior to Admission medications   Medication Sig Start Date End Date Taking? Authorizing Provider  albuterol (VENTOLIN HFA) 108 (90 Base) MCG/ACT inhaler Inhale 1-2 puffs into the lungs every 6 (six) hours as needed for wheezing or shortness of breath. 05/24/19   Betancourt, Jarold Song, NP  amoxicillin-clavulanate (AUGMENTIN) 875-125 MG tablet Take 1 tablet by mouth 2 (two) times daily. 05/24/19   Betancourt, Jarold Song, NP  benzonatate (TESSALON) 100 MG capsule Take 1 capsule (100 mg total) by mouth 3 (three) times daily as needed for cough. 05/20/19   Tilden Fossa, MD  Calcium Carb-Cholecalciferol (CALCIUM 600-D PO) Take 1 tablet by mouth in the morning and at bedtime.    [provider]  Cholecalciferol (VITAMIN D3 PO) Take 1 tablet by mouth daily.    [provider]  ibuprofen (ADVIL) 200 MG tablet Take 400 mg by mouth every 6 (six) hours as needed for headache (pain).    [provider]  Multiple Vitamin (MULTIVITAMIN WITH MINERALS) TABS tablet Take 1 tablet by mouth daily.    [provider]  OVER THE COUNTER MEDICATION Apply 1 application topically in the morning and at bedtime. Over the counter arthritis pain cream  [provider]  OVER THE COUNTER MEDICATION Take 1 tablet by mouth daily. Over the counter allergy medication    [provider]  predniSONE (STERAPRED UNI-PAK 21 TAB) 10 MG (21) TBPK tablet Taper take 6 pills by mouth day 1; 5 pills day 2; 4 pills day 3; 3 pills day 4; 2 pills day 5 and 1 pill day 6 with breakfast daily 05/24/19   Betancourt, Aura Fey, NP    Allergies    Fish allergy  Review of Systems   Review of Systems  Constitutional: Positive for chills. Negative for fever.  HENT: Negative for congestion and sore throat.        Positive for abnormal sensation to mouth/throat.     Respiratory: Negative for cough and shortness of breath.   Cardiovascular: Negative for chest pain.  Gastrointestinal: Negative for abdominal pain, blood in stool, constipation, diarrhea, nausea and vomiting.  Skin: Positive for wound.  Neurological: Negative for dizziness, syncope, speech difficulty, numbness and headaches.  All other systems reviewed and are negative.   Physical Exam Updated Vital Signs BP (!) 160/72 (BP Location: Left Arm)   Pulse 77   Temp 97.7 F (36.5 C) (Oral)   Resp 15   SpO2 95%   Physical Exam Vitals and nursing note reviewed.  Constitutional:      General: She is not in acute distress.    Appearance: She is well-developed. She is not toxic-appearing.  HENT:     Head: Normocephalic and atraumatic.     Mouth/Throat:     Mouth: No angioedema.     Pharynx: Oropharynx is clear. Uvula midline. No pharyngeal swelling or uvula swelling.     Comments: No visible signs of angioedema.  Posterior oropharynx is symmetric appearing. Patient tolerating own secretions without difficulty. No trismus. No drooling. No hot potato voice. No swelling beneath the tongue, submandibular compartment is soft.  Eyes:     General:        Right eye: No discharge.        Left eye: No discharge.     Extraocular Movements: Extraocular movements intact.     Conjunctiva/sclera: Conjunctivae normal.     Pupils: Pupils are equal, round, and reactive to light.  Cardiovascular:     Rate and Rhythm: Normal rate and regular rhythm.  Pulmonary:     Effort: Pulmonary effort is normal. No respiratory distress.     Breath sounds: Normal breath sounds. No stridor. No wheezing, rhonchi or rales.  Abdominal:     General: There is no distension.     Palpations: Abdomen is soft.     Tenderness: There is no abdominal tenderness.  Musculoskeletal:     Cervical back: Neck supple.  Skin:    General: Skin is warm and dry.     Findings: No rash. Rash is not urticarial.       Neurological:      Mental Status: She is alert.     Comments: Clear speech. Sensation & strength grossly intact x 4. No facial droop. CN III-XII grossly intact.   Psychiatric:        Behavior: Behavior normal.     ED Results / Procedures / Treatments   Labs (all labs ordered are listed, but only abnormal results are displayed) Labs Reviewed  BASIC METABOLIC PANEL - Abnormal; Notable for the following components:      Result Value   Glucose, Bld 109 (*)    All other components within normal limits  FIBRINOGEN -  Abnormal; Notable for the following components:   Fibrinogen 478 (*)    All other components within normal limits  CBC WITH DIFFERENTIAL/PLATELET  PROTIME-INR  CBC WITH DIFFERENTIAL/PLATELET  CBC WITH DIFFERENTIAL/PLATELET  PROTIME-INR  PROTIME-INR  FIBRINOGEN  FIBRINOGEN    EKG EKG Interpretation  Date/Time:  Tuesday Jul 05 2019 11:26:51 EDT Ventricular Rate:  56 PR Interval:    QRS Duration: 94 QT Interval:  416 QTC Calculation: 402 R Axis:   1 Text Interpretation: Sinus rhythm Low voltage, precordial leads Left ventricular hypertrophy Nonspecific T abnormalities, inferior leads No significant change since last tracing Confirmed by Gwyneth Sprout ([id number]) on 07/05/2019 11:39:25 AM   Radiology No results found.  Procedures Procedures (including critical care time)  Medications Ordered in ED Medications - No data to display  ED Course  I have reviewed the triage vital signs and the nursing notes.  Pertinent labs & imaging results that were available during my care of the patient were reviewed by me and considered in my medical decision making (see chart for details).    MDM Rules/Calculators/A&P                     Patient presents to the ED s/p snakebite @ 0900 to the scalp. She has a photo of the snake with her and this is concerning for a copperhead. She is nontoxic, resting comfortably, noted to be somewhat hypertensive. Symptomatically she states her  mouth/throat feel strange, she has no stridor or signs of angioedema, no other signs of allergic reaction- will give 25 mg of PO benadryl per discussion with Dr. Anitra Lauth. She also mentions that the bite mark site is pruritic, she has some chills, and that her hands feel heavy, no neuro deficits noted. Bite site without erythema/ecchymosis. Will obtain blood work & baseline EKG with plan for observation.   Her initial blood work is overall reassuring.  Fibrinogen is mildly elevated.  No findings on reexam or blood work to indicate antivenom at this time.  Case was discussed with poison control by RN:  No ice No steroids/abx Check hourly X3 hours for sensation PT INR Fibrinogin CBC at 6 hour mark (1500) Tetanus  Last tetanus was within 10 years.  13:00: On Re-evaluation bite mark spot remains the same, she has had improvement in her sxs, she remains without angioedema/stridor, no neuro deficits. Will continue to monitor. Patient & her daughter updated on results & plan of care at this time.   Patient care transitioned to supervising physician Dr. Anitra Lauth pending repeat labs & disposition.   Final Clinical Impression(s) / ED Diagnoses Final diagnoses:  None    Rx / DC Orders ED Discharge Orders    None       Cherly Anderson, PA-C 07/05/19 1514    Gwyneth Sprout, MD 07/05/19 [phone]

## 2019-07-05 NOTE — ED Triage Notes (Signed)
Pt here pov after possible snake bite to R posterior head. Pt states opened a door and snake fell. Reports itching to the site.

## 2019-09-14 ENCOUNTER — Other Ambulatory Visit: Payer: Self-pay

## 2019-09-15 ENCOUNTER — Encounter: Payer: Self-pay | Admitting: Nurse Practitioner

## 2019-09-15 ENCOUNTER — Ambulatory Visit (INDEPENDENT_AMBULATORY_CARE_PROVIDER_SITE_OTHER): Payer: Self-pay | Admitting: Nurse Practitioner

## 2019-09-15 VITALS — BP 138/90 | HR 63 | Temp 97.1°F | Resp 18 | Wt 158.0 lb

## 2019-09-15 DIAGNOSIS — M25541 Pain in joints of right hand: Secondary | ICD-10-CM | POA: Insufficient documentation

## 2019-09-15 DIAGNOSIS — M25542 Pain in joints of left hand: Secondary | ICD-10-CM

## 2019-09-15 LAB — SEDIMENTATION RATE: Sed Rate: 35 mm/hr — ABNORMAL HIGH (ref 0–30)

## 2019-09-15 LAB — C-REACTIVE PROTEIN: CRP: <1 mg/dL (ref 0.5–20.0)

## 2019-09-15 MED ORDER — MELOXICAM 7.5 MG PO TABS: 7.5000 mg | ORAL_TABLET | Freq: Every day | ORAL | 5 refills | Status: DC

## 2019-09-15 NOTE — Assessment & Plan Note (Addendum)
Chronic, worsening, constant, associated with stiffness which is worse in AM and does not improve with activity. Also has shoulder and hip stiffness, worse in AM. No joint effusion or erythema. Works full time, Designer, television/film set. Fhx of OA (mother). No known Fhx of inflammatory joint disorder. Some improvement with CBD oil.  Start meloxicam Check ESR, CRP and Rh-factor Advised about possibly decreasing working hours, hand exercise and use of copper gloves.

## 2019-09-15 NOTE — Progress Notes (Signed)
Subjective:  Patient ID: Alyssa Mccormick, female    DOB: 07/05/1950  Age: 69 y.o. MRN: [id number]  CC: Arthritis (on going for 3 months in both hands. Getting to the point where it is hard to drive. )  HPI Accompanied by daughter whom translate from [place] to [place].  Arthralgia of both hands Chronic, worsening, constant, associated with stiffness which is worse in AM and does not improve with activity. Also has shoulder and hip stiffness, worse in AM. No joint effusion or erythema. Works full time, Designer, television/film set. Fhx of OA (mother). No known Fhx of inflammatory joint disorder. Some improvement with CBD oil.  Start meloxicam Check ESR, CRP and Rh-factor Advised about possibly decreasing working hours, hand exercise and use of copper gloves.    Reviewed past Medical, Social and Family history today.  Outpatient Medications Prior to Visit  Medication Sig Dispense Refill  . Calcium Carb-Cholecalciferol (CALCIUM 600-D PO) Take 1 tablet by mouth in the morning and at bedtime.    . Cholecalciferol (VITAMIN D3 PO) Take 1 tablet by mouth daily.    . Multiple Vitamin (MULTIVITAMIN WITH MINERALS) TABS tablet Take 1 tablet by mouth daily.    Marland Kitchen ibuprofen (ADVIL) 200 MG tablet Take 400 mg by mouth every 6 (six) hours as needed for fever, headache or mild pain.     Marland Kitchen albuterol (VENTOLIN HFA) 108 (90 Base) MCG/ACT inhaler Inhale 1-2 puffs into the lungs every 6 (six) hours as needed for wheezing or shortness of breath. (Patient not taking: Reported on 07/05/2019) 6.7 g 0  . benzonatate (TESSALON) 100 MG capsule Take 1 capsule (100 mg total) by mouth 3 (three) times daily as needed for cough. (Patient not taking: Reported on 07/05/2019) 12 capsule 0  . predniSONE (STERAPRED UNI-PAK 21 TAB) 10 MG (21) TBPK tablet Taper take 6 pills by mouth day 1; 5 pills day 2; 4 pills day 3; 3 pills day 4; 2 pills day 5 and 1 pill day 6 with breakfast daily 21 tablet 0   No facility-administered medications  prior to visit.    ROS See HPI  Objective:  BP (!) 138/90 (BP Location: Left Arm, Patient Position: Sitting, Cuff Size: Normal)   Pulse 63   Temp (!) 97.1 F (36.2 C) (Temporal)   Resp 18   Wt 158 lb (71.7 kg)   SpO2 97%   BMI 29.85 kg/m   Physical Exam Cardiovascular:     Rate and Rhythm: Normal rate.     Pulses: Normal pulses.  Musculoskeletal:     Right wrist: Deformity, tenderness and bony tenderness present. No swelling, effusion, snuff box tenderness or crepitus. Decreased range of motion. Normal pulse.     Left wrist: Deformity, tenderness and bony tenderness present. No swelling, effusion, snuff box tenderness or crepitus. Decreased range of motion. Normal pulse.     Right hand: Deformity, tenderness and bony tenderness present. No swelling or lacerations. Decreased range of motion. Normal strength. Normal sensation. There is no disruption of two-point discrimination. Normal capillary refill. Normal pulse.     Left hand: Tenderness and bony tenderness present. No swelling, deformity or lacerations. Decreased range of motion. Normal strength. Normal sensation. There is no disruption of two-point discrimination. Normal capillary refill. Normal pulse.     Comments: Bouchard's nodes (bilateral) Squaring og [facility] joint (bilateral)  Neurological:     Mental Status: She is alert and oriented to person, place, and time.     Assessment & Plan:  This visit occurred  during the SARS-CoV-2 public health emergency.  Safety protocols were in place, including screening questions prior to the visit, additional usage of staff PPE, and extensive cleaning of exam room while observing appropriate contact time as indicated for disinfecting solutions.   Adely was seen today for arthritis.  Diagnoses and all orders for this visit:  Arthralgia of both hands -     Sedimentation rate -     C-reactive protein -     Rheumatoid Factor -     meloxicam (MOBIC) 7.5 MG tablet; Take 1 tablet (7.5  mg total) by mouth daily.    Problem List Items Addressed This Visit      Other   Arthralgia of both hands - Primary    Chronic, worsening, constant, associated with stiffness which is worse in AM and does not improve with activity. Also has shoulder and hip stiffness, worse in AM. No joint effusion or erythema. Works full time, Designer, television/film set. Fhx of OA (mother). No known Fhx of inflammatory joint disorder. Some improvement with CBD oil.  Start meloxicam Check ESR, CRP and Rh-factor Advised about possibly decreasing working hours, hand exercise and use of copper gloves.        Relevant Medications   meloxicam (MOBIC) 7.5 MG tablet   Other Relevant Orders   Sedimentation rate   C-reactive protein   Rheumatoid Factor      Follow-up: Return if symptoms worsen or fail to improve.  Wilfred Lacy, NP

## 2019-09-15 NOTE — Patient Instructions (Signed)
Use Copper gloves Go to lab for blood draw.  Joint Pain  Joint pain can be caused by many things. It is likely to go away if you follow instructions from your doctor for taking care of yourself at home. Sometimes, you may need more treatment. Follow these instructions at home: Managing pain, stiffness, and swelling   If told, put ice on the painful area. ? Put ice in a plastic bag. ? Place a towel between your skin and the bag. ? Leave the ice on for 20 minutes, 2-3 times a day.  If told, put heat on the painful area. Do this as often as told by your doctor. Use the heat source that your doctor recommends, such as a moist heat pack or a heating pad. ? Place a towel between your skin and the heat source. ? Leave the heat on for 20-30 minutes. ? Take off the heat if your skin gets bright red. This is especially important if you are unable to feel pain, heat, or cold. You may have a greater risk of getting burned.  Move your fingers or toes below the painful joint often. This helps with stiffness and swelling.  If possible, raise (elevate) the painful joint above the level of your heart while you are sitting or lying down. To do this, try putting a few pillows under the painful joint. Activity  Rest the painful joint for as long as told. Do not do things that cause pain or make your pain worse.  Begin exercising or stretching the affected area, as told by your doctor. Ask your doctor what types of exercise are safe for you. If you have an elastic bandage, sling, or splint:  Wear the device as told by your doctor. Take it off only as told by your doctor.  Loosen the device if your fingers or toes below the joint: ? Tingle. ? Lose feeling (get numb). ? Get cold and blue.  Keep the device clean.  Ask your doctor if you should take off the device before bathing. You may need to cover it with a watertight covering when you take a bath or a shower. General instructions  Take  over-the-counter and prescription medicines only as told by your doctor.  Do not use any products that contain nicotine or tobacco. These include cigarettes and e-cigarettes. If you need help quitting, ask your doctor.  Keep all follow-up visits as told by your doctor. This is important. Contact a doctor if:  You have pain that gets worse and does not get better with medicine.  Your joint pain does not get better in 3 days.  You have more bruising or swelling.  You have a fever.  You lose 10 lb (4.5 kg) or more without trying. Get help right away if:  You cannot move the joint.  Your fingers or toes tingle, lose feeling, or get cold and blue.  You have a fever along with a joint that is red, warm, and swollen. Summary  Joint pain can be caused by many things. It often goes away if you follow instructions from your doctor for taking care of yourself at home.  Rest the painful joint for as long as told. Do not do things that cause pain or make your pain worse.  Take over-the-counter and prescription medicines only as told by your doctor. This information is not intended to replace advice given to you by your health care provider. Make sure you discuss any questions you have with your  health care provider. Document Revised: 01/16/2017 Document Reviewed: 11/19/2016 Elsevier Patient Education  2020 [employer]   Hand Exercises Hand exercises can be helpful for almost anyone. These exercises can strengthen the hands, improve flexibility and movement, and increase blood flow to the hands. These results can make work and daily tasks easier. Hand exercises can be especially helpful for people who have joint pain from arthritis or have nerve damage from overuse (carpal tunnel syndrome). These exercises can also help people who have injured a hand. Exercises Most of these hand exercises are gentle stretching and motion exercises. It is usually safe to do them often throughout the day.  Warming up your hands before exercise may help to reduce stiffness. You can do this with gentle massage or by placing your hands in warm water for 10-15 minutes. It is normal to feel some stretching, pulling, tightness, or mild discomfort as you begin new exercises. This will gradually improve. Stop an exercise right away if you feel sudden, severe pain or your pain gets worse. Ask your health care provider which exercises are best for you. Knuckle bend or "claw" fist 1. Stand or sit with your arm, hand, and all five fingers pointed straight up. Make sure to keep your wrist straight during the exercise. 2. Gently bend your fingers down toward your palm until the tips of your fingers are touching the top of your palm. Keep your big knuckle straight and just bend the small knuckles in your fingers. 3. Hold this position for __________ seconds. 4. Straighten (extend) your fingers back to the starting position. Repeat this exercise 5-10 times with each hand. Full finger fist 1. Stand or sit with your arm, hand, and all five fingers pointed straight up. Make sure to keep your wrist straight during the exercise. 2. Gently bend your fingers into your palm until the tips of your fingers are touching the middle of your palm. 3. Hold this position for __________ seconds. 4. Extend your fingers back to the starting position, stretching every joint fully. Repeat this exercise 5-10 times with each hand. Straight fist 1. Stand or sit with your arm, hand, and all five fingers pointed straight up. Make sure to keep your wrist straight during the exercise. 2. Gently bend your fingers at the big knuckle, where your fingers meet your hand, and the middle knuckle. Keep the knuckle at the tips of your fingers straight and try to touch the bottom of your palm. 3. Hold this position for __________ seconds. 4. Extend your fingers back to the starting position, stretching every joint fully. Repeat this exercise 5-10 times  with each hand. Tabletop 1. Stand or sit with your arm, hand, and all five fingers pointed straight up. Make sure to keep your wrist straight during the exercise. 2. Gently bend your fingers at the big knuckle, where your fingers meet your hand, as far down as you can while keeping the small knuckles in your fingers straight. Think of forming a tabletop with your fingers. 3. Hold this position for __________ seconds. 4. Extend your fingers back to the starting position, stretching every joint fully. Repeat this exercise 5-10 times with each hand. Finger spread 1. Place your hand flat on a table with your palm facing down. Make sure your wrist stays straight as you do this exercise. 2. Spread your fingers and thumb apart from each other as far as you can until you feel a gentle stretch. Hold this position for __________ seconds. 3. Bring your fingers  and thumb tight together again. Hold this position for __________ seconds. Repeat this exercise 5-10 times with each hand. Making circles 1. Stand or sit with your arm, hand, and all five fingers pointed straight up. Make sure to keep your wrist straight during the exercise. 2. Make a circle by touching the tip of your thumb to the tip of your index finger. 3. Hold for __________ seconds. Then open your hand wide. 4. Repeat this motion with your thumb and each finger on your hand. Repeat this exercise 5-10 times with each hand. Thumb motion 1. Sit with your forearm resting on a table and your wrist straight. Your thumb should be facing up toward the ceiling. Keep your fingers relaxed as you move your thumb. 2. Lift your thumb up as high as you can toward the ceiling. Hold for __________ seconds. 3. Bend your thumb across your palm as far as you can, reaching the tip of your thumb for the small finger (pinkie) side of your palm. Hold for __________ seconds. Repeat this exercise 5-10 times with each hand. Grip strengthening  1. Hold a stress ball  or other soft ball in the middle of your hand. 2. Slowly increase the pressure, squeezing the ball as much as you can without causing pain. Think of bringing the tips of your fingers into the middle of your palm. All of your finger joints should bend when doing this exercise. 3. Hold your squeeze for __________ seconds, then relax. Repeat this exercise 5-10 times with each hand. Contact a health care provider if:  Your hand pain or discomfort gets much worse when you do an exercise.  Your hand pain or discomfort does not improve within 2 hours after you exercise. If you have any of these problems, stop doing these exercises right away. Do not do them again unless your health care provider says that you can. Get help right away if:  You develop sudden, severe hand pain or swelling. If this happens, stop doing these exercises right away. Do not do them again unless your health care provider says that you can. This information is not intended to replace advice given to you by your health care provider. Make sure you discuss any questions you have with your health care provider. Document Revised: 05/27/2018 Document Reviewed: 02/04/2018 Elsevier Patient Education  2020 [employer].

## 2019-09-16 LAB — RHEUMATOID FACTOR: Rheumatoid fact SerPl-aCnc: <14 IU/mL (ref <14)

## 2019-10-14 ENCOUNTER — Encounter: Payer: PRIVATE HEALTH INSURANCE | Admitting: Family Medicine

## 2019-11-14 ENCOUNTER — Encounter: Payer: Self-pay | Admitting: Family Medicine

## 2019-11-14 ENCOUNTER — Ambulatory Visit (INDEPENDENT_AMBULATORY_CARE_PROVIDER_SITE_OTHER): Payer: PRIVATE HEALTH INSURANCE

## 2019-11-14 ENCOUNTER — Ambulatory Visit (INDEPENDENT_AMBULATORY_CARE_PROVIDER_SITE_OTHER): Payer: PRIVATE HEALTH INSURANCE | Admitting: Family Medicine

## 2019-11-14 ENCOUNTER — Other Ambulatory Visit: Payer: Self-pay

## 2019-11-14 VITALS — BP 116/62 | HR 78 | Temp 97.7°F | Ht 61.0 in | Wt 161.6 lb

## 2019-11-14 DIAGNOSIS — M19041 Primary osteoarthritis, right hand: Secondary | ICD-10-CM

## 2019-11-14 DIAGNOSIS — Z0000 Encounter for general adult medical examination without abnormal findings: Secondary | ICD-10-CM

## 2019-11-14 DIAGNOSIS — Z91013 Allergy to seafood: Secondary | ICD-10-CM

## 2019-11-14 NOTE — Patient Instructions (Addendum)
Health Maintenance After Age 70 After age 70, you are at a higher risk for certain long-term diseases and infections as well as injuries from falls. Falls are a major cause of broken bones and head injuries in people who are older than age 70. Getting regular preventive care can help to keep you healthy and well. Preventive care includes getting regular testing and making lifestyle changes as recommended by your health care provider. Talk with your health care provider about:  Which screenings and tests you should have. A screening is a test that checks for a disease when you have no symptoms.  A diet and exercise plan that is right for you. What should I know about screenings and tests to prevent falls? Screening and testing are the best ways to find a health problem early. Early diagnosis and treatment give you the best chance of managing medical conditions that are common after age 70. Certain conditions and lifestyle choices may make you more likely to have a fall. Your health care provider may recommend:  Regular vision checks. Poor vision and conditions such as cataracts can make you more likely to have a fall. If you wear glasses, make sure to get your prescription updated if your vision changes.  Medicine review. Work with your health care provider to regularly review all of the medicines you are taking, including over-the-counter medicines. Ask your health care provider about any side effects that may make you more likely to have a fall. Tell your health care provider if any medicines that you take make you feel dizzy or sleepy.  Osteoporosis screening. Osteoporosis is a condition that causes the bones to get weaker. This can make the bones weak and cause them to break more easily.  Blood pressure screening. Blood pressure changes and medicines to control blood pressure can make you feel dizzy.  Strength and balance checks. Your health care provider may recommend certain tests to check your  strength and balance while standing, walking, or changing positions.  Foot health exam. Foot pain and numbness, as well as not wearing proper footwear, can make you more likely to have a fall.  Depression screening. You may be more likely to have a fall if you have a fear of falling, feel emotionally low, or feel unable to do activities that you used to do.  Alcohol use screening. Using too much alcohol can affect your balance and may make you more likely to have a fall. What actions can I take to lower my risk of falls? General instructions  Talk with your health care provider about your risks for falling. Tell your health care provider if: ? You fall. Be sure to tell your health care provider about all falls, even ones that seem minor. ? You feel dizzy, sleepy, or off-balance.  Take over-the-counter and prescription medicines only as told by your health care provider. These include any supplements.  Eat a healthy diet and maintain a healthy weight. A healthy diet includes low-fat dairy products, low-fat (lean) meats, and fiber from whole grains, beans, and lots of fruits and vegetables. Home safety  Remove any tripping hazards, such as rugs, cords, and clutter.  Install safety equipment such as grab bars in bathrooms and safety rails on stairs.  Keep rooms and walkways well-lit. Activity   Follow a regular exercise program to stay fit. This will help you maintain your balance. Ask your health care provider what types of exercise are appropriate for you.  If you need a cane or   walker, use it as recommended by your health care provider.  Wear supportive shoes that have nonskid soles. Lifestyle  Do not drink alcohol if your health care provider tells you not to drink.  If you drink alcohol, limit how much you have: ? 0-1 drink a day for women. ? 0-2 drinks a day for men.  Be aware of how much alcohol is in your drink. In the [place] one drink equals one typical bottle of beer (12  oz), one-half glass of wine (5 oz), or one shot of hard liquor (1 oz).  Do not use any products that contain nicotine or tobacco, such as cigarettes and e-cigarettes. If you need help quitting, ask your health care provider. Summary  Having a healthy lifestyle and getting preventive care can help to protect your health and wellness after age 70.  Screening and testing are the best way to find a health problem early and help you avoid having a fall. Early diagnosis and treatment give you the best chance for managing medical conditions that are more common for people who are older than age 70.  Falls are a major cause of broken bones and head injuries in people who are older than age 70. Take precautions to prevent a fall at home.  Work with your health care provider to learn what changes you can make to improve your health and wellness and to prevent falls. This information is not intended to replace advice given to you by your health care provider. Make sure you discuss any questions you have with your health care provider. Document Revised: 05/27/2018 Document Reviewed: 12/17/2016 Elsevier Patient Education  2020 [employer] Inc.  

## 2019-11-14 NOTE — Progress Notes (Addendum)
Established Patient Office Visit  Subjective:  Patient ID: Alyssa Mccormick, female    DOB: 03/10/1950  Age: 69 y.o. MRN: [id number]  CC:  Chief Complaint  Patient presents with  . Transitions Of Care    TOC from Dr. Dayton Martes,  no concerns.     HPI Alyssa Mccormick presents for a checkup.  She is accompanied by her daughter who is helping to translate.  Patient has been doing well.  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  She is right-hand dominant and has had pain in her MCPs over a period of time.  Is been relieved with the meloxicam.  She is having particular discomfort in the fourth MCP.  The joint does not get locked but it is painful for her.  Well woman care is through GYN.  Past Medical History:  Diagnosis Date  . Arthritis    hands  . Diverticulosis   . Gall stones     Past Surgical History:  Procedure Laterality Date  . ABDOMINAL HYSTERECTOMY      Family History  Problem Relation Age of Onset  . Diabetes Mother   . Esophageal cancer Father   . Colon cancer Neg Hx   . Colon polyps Neg Hx   . Rectal cancer Neg Hx   . Stomach cancer Neg Hx     Social History   Socioeconomic History  . Marital status: Widowed    Spouse name: Not on file  . Number of children: Not on file  . Years of education: Not on file  . Highest education level: Not on file  Occupational History  . Not on file  Tobacco Use  . Smoking status: Never Smoker  . Smokeless tobacco: Never Used  Vaping Use  . Vaping Use: Never used  Substance and Sexual Activity  . Alcohol use: No    Alcohol/week: 0.0 standard drinks  . Drug use: No  . Sexual activity: Not on file  Other Topics Concern  . Not on file  Social History Narrative  . Not on file   Social Determinants of Health   Financial Resource Strain:   . Difficulty of Paying Living Expenses: Not on file  Food Insecurity:   . Worried About Programme researcher, broadcasting/film/video in the Last Year: Not  on file  . Ran Out of Food in the Last Year: Not on file  Transportation Needs:   . Lack of Transportation (Medical): Not on file  . Lack of Transportation (Non-Medical): Not on file  Physical Activity:   . Days of Exercise per Week: Not on file  . Minutes of Exercise per Session: Not on file  Stress:   . Feeling of Stress : Not on file  Social Connections:   . Frequency of Communication with Friends and Family: Not on file  . Frequency of Social Gatherings with Friends and Family: Not on file  . Attends Religious Services: Not on file  . Active Member of Clubs or Organizations: Not on file  . Attends Banker Meetings: Not on file  . Marital Status: Not on file  Intimate Partner Violence:   . Fear of Current or Ex-Partner: Not on file  . Emotionally Abused: Not on file  . Physically Abused: Not on file  . Sexually Abused: Not on file    Outpatient Medications Prior to Visit  Medication Sig Dispense Refill  . Calcium Carb-Cholecalciferol (CALCIUM 600-D PO) Take 1 tablet by mouth in the  morning and at bedtime.    . Cholecalciferol (VITAMIN D3 PO) Take 1 tablet by mouth daily.    . meloxicam (MOBIC) 7.5 MG tablet Take 1 tablet (7.5 mg total) by mouth daily. 30 tablet 5  . Multiple Vitamin (MULTIVITAMIN WITH MINERALS) TABS tablet Take 1 tablet by mouth daily.     No facility-administered medications prior to visit.    Allergies  Allergen Reactions  . Fish Allergy Anaphylaxis and Hives    ROS Review of Systems  Constitutional: Negative.   HENT: Negative.   Respiratory: Negative.   Cardiovascular: Negative.   Gastrointestinal: Negative.   Endocrine: Negative for polyphagia and polyuria.  Genitourinary: Negative.   Musculoskeletal: Positive for arthralgias.  Allergic/Immunologic: Negative for immunocompromised state.  Neurological: Negative.   Hematological: Does not bruise/bleed easily.  Psychiatric/Behavioral: Negative.    Depression screen PHQ 2/9  10/11/2018  Decreased Interest 0  Down, Depressed, Hopeless 0  PHQ - 2 Score 0      Objective:    Physical Exam Vitals and nursing note reviewed.  Constitutional:      General: She is not in acute distress.    Appearance: Normal appearance. She is normal weight. She is not ill-appearing, toxic-appearing or diaphoretic.  HENT:     Head: Normocephalic and atraumatic.     Right Ear: Tympanic membrane, ear canal and external ear normal.     Left Ear: Tympanic membrane, ear canal and external ear normal.     Mouth/Throat:     Mouth: Mucous membranes are moist.     Pharynx: Oropharynx is clear. No oropharyngeal exudate or posterior oropharyngeal erythema.  Eyes:     General: No scleral icterus.       Right eye: No discharge.        Left eye: No discharge.     Extraocular Movements: Extraocular movements intact.     Conjunctiva/sclera: Conjunctivae normal.     Pupils: Pupils are equal, round, and reactive to light.  Cardiovascular:     Rate and Rhythm: Normal rate and regular rhythm.  Pulmonary:     Effort: Pulmonary effort is normal.     Breath sounds: Normal breath sounds.  Abdominal:     General: Bowel sounds are normal.  Musculoskeletal:     Right hand: Swelling, tenderness and bony tenderness present. No deformity. Normal range of motion. Normal strength. Normal sensation. Normal capillary refill. Normal pulse.     Cervical back: No rigidity or tenderness.     Right lower leg: No edema.     Left lower leg: No edema.  Lymphadenopathy:     Cervical: No cervical adenopathy.  Skin:    General: Skin is warm and dry.  Neurological:     Mental Status: She is alert and oriented to person, place, and time.  Psychiatric:        Mood and Affect: Mood normal.        Behavior: Behavior normal.     BP 116/62   Pulse 78   Temp 97.7 F (36.5 C) (Tympanic)   Ht 5\' 1"  (1.549 m)   Wt 161 lb 9.6 oz (73.3 kg)   SpO2 96%   BMI 30.53 kg/m  Wt Readings from Last 3 Encounters:   11/14/19 161 lb 9.6 oz (73.3 kg)  09/15/19 158 lb (71.7 kg)  05/20/19 153 lb (69.4 kg)     Health Maintenance Due  Topic Date Due  . Hepatitis C Screening  Never done  . PNA vac Low Risk Adult (  1 of 2 - PCV13) Never done  . MAMMOGRAM  04/14/2019    There are no preventive care reminders to display for this patient.  Lab Results  Component Value Date   TSH 2.61 10/11/2018   Lab Results  Component Value Date   WBC 7.0 07/05/2019   HGB 13.3 07/05/2019   HCT 40.3 07/05/2019   MCV 96.4 07/05/2019   PLT 253 07/05/2019   Lab Results  Component Value Date   NA 138 07/05/2019   K 4.1 07/05/2019   CO2 26 07/05/2019   GLUCOSE 109 (H) 07/05/2019   BUN 14 07/05/2019   CREATININE 0.58 07/05/2019   BILITOT 0.4 10/11/2018   ALKPHOS 69 10/11/2018   AST 17 10/11/2018   ALT 12 10/11/2018   PROT 6.7 10/11/2018   ALBUMIN 4.0 10/11/2018   CALCIUM 9.3 07/05/2019   ANIONGAP 12 07/05/2019   GFR 93.52 10/11/2018   Lab Results  Component Value Date   CHOL 224 (H) 10/11/2018   Lab Results  Component Value Date   HDL 50.40 10/11/2018   Lab Results  Component Value Date   LDLCALC 142 (H) 10/11/2018   Lab Results  Component Value Date   TRIG 160.0 (H) 10/11/2018   Lab Results  Component Value Date   CHOLHDL 4 10/11/2018   Lab Results  Component Value Date   HGBA1C 5.7 10/11/2018      Assessment & Plan:   Problem List Items Addressed This Visit    None    Visit Diagnoses    Healthcare maintenance    -  Primary   Relevant Orders   CBC   Comprehensive metabolic panel   Lipid panel   Urinalysis, Routine w reflex microscopic   Arthritis of right hand       Relevant Orders   DG Hand Complete Right      No orders of the defined types were placed in this encounter.   Follow-up: Return in about 1 year (around 11/13/2020), or try voltaren gel for hand. hand doctor referral if needed. return fasting for labs..   Patient was given information on health maintenance.   Suggested that she try Voltaren gel.  Otherwise we can continue meloxicam.  Will consider referral to hand doctor as needed. Mliss Sax, MD   06/24/19 addendum: has fish allergy. Requests epi pen.

## 2019-11-17 ENCOUNTER — Other Ambulatory Visit: Payer: Self-pay

## 2019-11-18 ENCOUNTER — Other Ambulatory Visit: Payer: PRIVATE HEALTH INSURANCE

## 2019-11-28 ENCOUNTER — Other Ambulatory Visit: Payer: Self-pay

## 2019-11-28 ENCOUNTER — Other Ambulatory Visit (INDEPENDENT_AMBULATORY_CARE_PROVIDER_SITE_OTHER): Payer: PRIVATE HEALTH INSURANCE

## 2019-11-28 DIAGNOSIS — Z0000 Encounter for general adult medical examination without abnormal findings: Secondary | ICD-10-CM | POA: Diagnosis not present

## 2019-11-28 LAB — COMPREHENSIVE METABOLIC PANEL
ALT: 12 U/L (ref 0–35)
AST: 15 U/L (ref 0–37)
Albumin: 4.1 g/dL (ref 3.5–5.2)
Alkaline Phosphatase: 65 U/L (ref 39–117)
BUN: 18 mg/dL (ref 6–23)
CO2: 31 mEq/L (ref 19–32)
Calcium: 9.5 mg/dL (ref 8.4–10.5)
Chloride: 102 mEq/L (ref 96–112)
Creatinine, Ser: 0.71 mg/dL (ref 0.40–1.20)
GFR: 85.84 mL/min (ref >60.00)
Glucose, Bld: 91 mg/dL (ref 70–99)
Potassium: 4.4 mEq/L (ref 3.5–5.1)
Sodium: 139 mEq/L (ref 135–145)
Total Bilirubin: 0.4 mg/dL (ref 0.2–1.2)
Total Protein: 6.7 g/dL (ref 6.0–8.3)

## 2019-11-28 LAB — URINALYSIS, ROUTINE W REFLEX MICROSCOPIC
Bilirubin Urine: NEGATIVE
Ketones, ur: NEGATIVE
Nitrite: NEGATIVE
Specific Gravity, Urine: 1.015 (ref 1.000–1.030)
Total Protein, Urine: NEGATIVE
Urine Glucose: NEGATIVE
Urobilinogen, UA: 0.2 (ref 0.0–1.0)
pH: 6.5 (ref 5.0–8.0)

## 2019-11-28 LAB — CBC
HCT: 41.1 % (ref 36.0–46.0)
Hemoglobin: 13.8 g/dL (ref 12.0–15.0)
MCHC: 33.7 g/dL (ref 30.0–36.0)
MCV: 94.7 fl (ref 78.0–100.0)
Platelets: 278 10*3/uL (ref 150.0–400.0)
RBC: 4.34 Mil/uL (ref 3.87–5.11)
RDW: 12 % (ref 11.5–15.5)
WBC: 6.4 10*3/uL (ref 4.0–10.5)

## 2019-11-28 LAB — LIPID PANEL
Cholesterol: 222 mg/dL — ABNORMAL HIGH (ref 0–200)
HDL: 60.3 mg/dL (ref >39.00)
LDL Cholesterol: 128 mg/dL — ABNORMAL HIGH (ref 0–99)
NonHDL: 161.86
Total CHOL/HDL Ratio: 4
Triglycerides: 169 mg/dL — ABNORMAL HIGH (ref 0.0–149.0)
VLDL: 33.8 mg/dL (ref 0.0–40.0)

## 2020-01-16 DIAGNOSIS — Z6828 Body mass index (BMI) 28.0-28.9, adult: Secondary | ICD-10-CM | POA: Diagnosis not present

## 2020-01-16 DIAGNOSIS — Z1231 Encounter for screening mammogram for malignant neoplasm of breast: Secondary | ICD-10-CM | POA: Diagnosis not present

## 2020-01-16 DIAGNOSIS — M1990 Unspecified osteoarthritis, unspecified site: Secondary | ICD-10-CM | POA: Insufficient documentation

## 2020-01-16 DIAGNOSIS — Z01419 Encounter for gynecological examination (general) (routine) without abnormal findings: Secondary | ICD-10-CM | POA: Diagnosis not present

## 2020-06-10 ENCOUNTER — Encounter: Payer: Self-pay | Admitting: Registered Nurse

## 2020-06-10 ENCOUNTER — Telehealth: Payer: Self-pay | Admitting: Registered Nurse

## 2020-06-10 DIAGNOSIS — Z20822 Contact with and (suspected) exposure to covid-19: Secondary | ICD-10-CM

## 2020-06-10 NOTE — Telephone Encounter (Signed)
HR manager Salley Slaughter notified NP that pt reported exposed on 06/01/2020 to a possible positive covid person.  Day 0 4/15  Day 5 4/20  Day 10 06/11/20   I recommend covid testing 06/11/2020 and wearing mask while at work 06/11/20.  Attempted to reach patient via telephone no answer no voicemail set up.

## 2020-06-12 NOTE — Telephone Encounter (Signed)
Coworker Stefan Church M spoke with HR at pt's request on Monday 4/25 to confirm that pt could report to work that day at 1500. HR Tim called RN for guidance and RN cleared to RTW with strict mask use thru that day 4/25. She had not completed a home test yet but was at Day 10 since exposure. Advised to complete one as soon as able. Pt reported to [name] that she tested this morning, Tues 4/26 and test was negative. Pt remains asymptomatic. No further needs. Closing encounter.

## 2020-06-12 NOTE — Telephone Encounter (Signed)
Reviewed RN Rolly Salter note agreed with plan of care negative home covid test.

## 2020-07-23 ENCOUNTER — Telehealth: Payer: Self-pay | Admitting: Family Medicine

## 2020-07-23 DIAGNOSIS — Z91013 Allergy to seafood: Secondary | ICD-10-CM

## 2020-07-23 NOTE — Telephone Encounter (Signed)
Pt daughter, Joana Reamer, called stating we didn't have appt so pt is at urgent care for bee sting and swelling. 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 Pt had recent snake bite. Pt has other allergies. Joana Reamer is requesting for Dr. Doreene Burke to prescribe EpiPen for pt. Please advise.   Call back # [phone]  [id number] [place], [place] - [address] AT [facility] [address] [facility] [address] Phone:  [phone]  Fax:  [phone]

## 2020-07-24 MED ORDER — EPINEPHRINE 0.3 MG/0.3ML IJ SOAJ: 0.3000 mg | INTRAMUSCULAR | 0 refills | Status: DC | PRN

## 2020-07-24 NOTE — Addendum Note (Signed)
Addended by: Andrez Grime on: 07/24/2020 01:12 PM   Modules accepted: Orders

## 2020-07-24 NOTE — Telephone Encounter (Signed)
For accidental exposure to fish only

## 2020-07-24 NOTE — Telephone Encounter (Signed)
Please advise message below, okay for epipen? Last OV 11/14/19 patient scheduled for next follow up visit.

## 2020-07-25 MED ORDER — EPINEPHRINE 0.3 MG/0.3ML IJ SOAJ: 0.3000 mg | INTRAMUSCULAR | 0 refills | Status: DC | PRN

## 2020-07-25 NOTE — Telephone Encounter (Signed)
Refill sent in patients daughter aware

## 2020-11-14 ENCOUNTER — Ambulatory Visit (INDEPENDENT_AMBULATORY_CARE_PROVIDER_SITE_OTHER): Payer: Medicare Other | Admitting: Family Medicine

## 2020-11-14 ENCOUNTER — Encounter: Payer: Self-pay | Admitting: Family Medicine

## 2020-11-14 ENCOUNTER — Ambulatory Visit (INDEPENDENT_AMBULATORY_CARE_PROVIDER_SITE_OTHER): Payer: Medicare Other

## 2020-11-14 ENCOUNTER — Other Ambulatory Visit: Payer: Self-pay

## 2020-11-14 VITALS — BP 176/92 | HR 59 | Temp 96.9°F | Ht 61.0 in | Wt 161.0 lb

## 2020-11-14 DIAGNOSIS — M5416 Radiculopathy, lumbar region: Secondary | ICD-10-CM | POA: Diagnosis not present

## 2020-11-14 DIAGNOSIS — M5114 Intervertebral disc disorders with radiculopathy, thoracic region: Secondary | ICD-10-CM | POA: Diagnosis not present

## 2020-11-14 DIAGNOSIS — Z0000 Encounter for general adult medical examination without abnormal findings: Secondary | ICD-10-CM | POA: Diagnosis not present

## 2020-11-14 DIAGNOSIS — M4724 Other spondylosis with radiculopathy, thoracic region: Secondary | ICD-10-CM | POA: Diagnosis not present

## 2020-11-14 DIAGNOSIS — M4726 Other spondylosis with radiculopathy, lumbar region: Secondary | ICD-10-CM | POA: Diagnosis not present

## 2020-11-14 DIAGNOSIS — M4186 Other forms of scoliosis, lumbar region: Secondary | ICD-10-CM | POA: Diagnosis not present

## 2020-11-14 LAB — URINALYSIS, ROUTINE W REFLEX MICROSCOPIC
Bilirubin Urine: NEGATIVE
Hgb urine dipstick: NEGATIVE
Ketones, ur: NEGATIVE
Leukocytes,Ua: NEGATIVE
Nitrite: NEGATIVE
Specific Gravity, Urine: 1.015 (ref 1.000–1.030)
Total Protein, Urine: NEGATIVE
Urine Glucose: NEGATIVE
Urobilinogen, UA: 0.2 (ref 0.0–1.0)
pH: 7.5 (ref 5.0–8.0)

## 2020-11-14 LAB — COMPREHENSIVE METABOLIC PANEL
ALT: 12 U/L (ref 0–35)
AST: 16 U/L (ref 0–37)
Albumin: 4 g/dL (ref 3.5–5.2)
Alkaline Phosphatase: 60 U/L (ref 39–117)
BUN: 15 mg/dL (ref 6–23)
CO2: 30 mEq/L (ref 19–32)
Calcium: 9.6 mg/dL (ref 8.4–10.5)
Chloride: 103 mEq/L (ref 96–112)
Creatinine, Ser: 0.63 mg/dL (ref 0.40–1.20)
GFR: 89.06 mL/min (ref >60.00)
Glucose, Bld: 90 mg/dL (ref 70–99)
Potassium: 4.2 mEq/L (ref 3.5–5.1)
Sodium: 140 mEq/L (ref 135–145)
Total Bilirubin: 0.6 mg/dL (ref 0.2–1.2)
Total Protein: 6.7 g/dL (ref 6.0–8.3)

## 2020-11-14 LAB — CBC
HCT: 39.9 % (ref 36.0–46.0)
Hemoglobin: 13.4 g/dL (ref 12.0–15.0)
MCHC: 33.5 g/dL (ref 30.0–36.0)
MCV: 94.3 fl (ref 78.0–100.0)
Platelets: 254 10*3/uL (ref 150.0–400.0)
RBC: 4.23 Mil/uL (ref 3.87–5.11)
RDW: 12.3 % (ref 11.5–15.5)
WBC: 6.3 10*3/uL (ref 4.0–10.5)

## 2020-11-14 LAB — LIPID PANEL
Cholesterol: 250 mg/dL — ABNORMAL HIGH (ref 0–200)
HDL: 60.9 mg/dL (ref >39.00)
LDL Cholesterol: 156 mg/dL — ABNORMAL HIGH (ref 0–99)
NonHDL: 189.25
Total CHOL/HDL Ratio: 4
Triglycerides: 168 mg/dL — ABNORMAL HIGH (ref 0.0–149.0)
VLDL: 33.6 mg/dL (ref 0.0–40.0)

## 2020-11-14 MED ORDER — PREDNISONE 10 MG (48) PO TBPK: ORAL_TABLET | ORAL | 0 refills | Status: DC

## 2020-11-14 MED ORDER — METHOCARBAMOL 500 MG PO TABS: 500.0000 mg | ORAL_TABLET | Freq: Three times a day (TID) | ORAL | 0 refills | Status: DC | PRN

## 2020-11-14 NOTE — Progress Notes (Signed)
Established Patient Office Visit  Subjective:  Patient ID: Alyssa Mccormick, female    DOB: 02-Mar-1950  Age: 70 y.o. MRN: [id number]  CC:  Chief Complaint  Patient presents with   Annual Exam    CPE, concerns about pain and numbness on right side of hip that radiates to inner right side of thigh and lower back.     HPI Alyssa Mccormick presents for physical exam and follow-up of the problem that she has been having with her lower back and right leg.  For the last 2 weeks she has been experiencing pain radiating down her lateral right leg to her knee.  There is associated numbness in the medial leg to the knee.  No injury.  No known provocative or palliative measures.  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  Past Medical History:  Diagnosis Date   Arthritis    hands   Diverticulosis    Gall stones     Past Surgical History:  Procedure Laterality Date   ABDOMINAL HYSTERECTOMY      Family History  Problem Relation Age of Onset   Diabetes Mother    Esophageal cancer Father    Colon cancer Neg Hx    Colon polyps Neg Hx    Rectal cancer Neg Hx    Stomach cancer Neg Hx     Social History   Socioeconomic History   Marital status: Widowed    Spouse name: Not on file   Number of children: Not on file   Years of education: Not on file   Highest education level: Not on file  Occupational History   Not on file  Tobacco Use   Smoking status: Never   Smokeless tobacco: Never  Vaping Use   Vaping Use: Never used  Substance and Sexual Activity   Alcohol use: No    Alcohol/week: 0.0 standard drinks   Drug use: No   Sexual activity: Not on file  Other Topics Concern   Not on file  Social History Narrative   Not on file   Social Determinants of Health   Financial Resource Strain: Not on file  Food Insecurity: Not on file  Transportation Needs: Not on file  Physical Activity: Not on file  Stress: Not on file  Social Connections: Not on  file  Intimate Partner Violence: Not on file    Outpatient Medications Prior to Visit  Medication Sig Dispense Refill   Calcium Carb-Cholecalciferol (CALCIUM 600-D PO) Take 1 tablet by mouth in the morning and at bedtime.     Cholecalciferol (VITAMIN D3 PO) Take 1 tablet by mouth daily.     diclofenac Sodium (VOLTAREN) 1 % GEL Apply topically 4 (four) times daily.     EPINEPHrine 0.3 mg/0.3 mL IJ SOAJ injection Inject 0.3 mg into the muscle as needed for anaphylaxis (For accidental exposure to fish only). For allergic reaction to accidental exposure to fish. 1 each 0   meloxicam (MOBIC) 7.5 MG tablet Take 1 tablet (7.5 mg total) by mouth daily. 30 tablet 5   Multiple Vitamin (MULTIVITAMIN WITH MINERALS) TABS tablet Take 1 tablet by mouth daily.     No facility-administered medications prior to visit.    Allergies  Allergen Reactions   Fish Allergy Anaphylaxis and Hives    ROS Review of Systems  Constitutional: Negative.   HENT: Negative.    Eyes:  Negative for photophobia and visual disturbance.  Respiratory: Negative.    Cardiovascular: Negative.   Gastrointestinal:  Negative.   Endocrine: Negative for polyphagia and polyuria.  Genitourinary: Negative.   Musculoskeletal:  Positive for back pain. Negative for myalgias.  Skin: Negative.   Neurological:  Positive for numbness. Negative for speech difficulty and weakness.  Depression screen [facility] 2/9 11/14/2020 11/14/2020 10/11/2018  Decreased Interest 0 0 0  Down, Depressed, Hopeless 0 0 0  PHQ - 2 Score 0 0 0  Altered sleeping 0 - -  Tired, decreased energy 0 - -  Change in appetite 0 - -  Feeling bad or failure about yourself  0 - -  Trouble concentrating 0 - -  Moving slowly or fidgety/restless 0 - -  Suicidal thoughts 0 - -  PHQ-9 Score 0 - -  Difficult doing work/chores Not difficult at all - -       Objective:    Physical Exam Vitals and nursing note reviewed.  Constitutional:      General: She is not in acute  distress.    Appearance: Normal appearance. She is normal weight. She is not ill-appearing, toxic-appearing or diaphoretic.  HENT:     Head: Normocephalic and atraumatic.     Right Ear: Tympanic membrane, ear canal and external ear normal.     Left Ear: Tympanic membrane, ear canal and external ear normal.     Mouth/Throat:     Mouth: Mucous membranes are moist.     Pharynx: Oropharynx is clear. No oropharyngeal exudate or posterior oropharyngeal erythema.  Eyes:     General: No scleral icterus.       Right eye: No discharge.        Left eye: No discharge.     Extraocular Movements: Extraocular movements intact.     Conjunctiva/sclera: Conjunctivae normal.     Pupils: Pupils are equal, round, and reactive to light.  Neck:     Vascular: No carotid bruit.  Cardiovascular:     Rate and Rhythm: Normal rate and regular rhythm.  Pulmonary:     Effort: Pulmonary effort is normal.     Breath sounds: Normal breath sounds.  Abdominal:     General: Abdomen is flat.  Musculoskeletal:     Cervical back: No rigidity or tenderness.     Lumbar back: No spasms, tenderness or bony tenderness. Normal range of motion. Negative right straight leg raise test and negative left straight leg raise test.     Right hip: Normal.     Left hip: Normal.     Right lower leg: No edema.     Left lower leg: No edema.  Lymphadenopathy:     Cervical: No cervical adenopathy.  Skin:    General: Skin is warm and dry.  Neurological:     Mental Status: She is alert and oriented to person, place, and time.     Motor: Motor function is intact.     Deep Tendon Reflexes:     Reflex Scores:      Patellar reflexes are 2+ on the right side and 2+ on the left side.      Achilles reflexes are 2+ on the right side and 2+ on the left side.    Comments: Patient developed her symptoms after we finished the exam.  Psychiatric:        Mood and Affect: Mood normal.        Behavior: Behavior normal.    BP 124/70 (BP Location:  Left Arm, Patient Position: Sitting, Cuff Size: Normal)   Pulse (!) 59   Temp (!) 96.9  F (36.1 C) (Temporal)   Ht 5\' 1"  (1.549 m)   Wt 161 lb (73 kg)   SpO2 98%   BMI 30.42 kg/m  Wt Readings from Last 3 Encounters:  11/14/20 161 lb (73 kg)  11/14/19 161 lb 9.6 oz (73.3 kg)  09/15/19 158 lb (71.7 kg)     Health Maintenance Due  Topic Date Due   Hepatitis C Screening  Never done   Zoster Vaccines- Shingrix (1 of 2) Never done   MAMMOGRAM  04/14/2019    There are no preventive care reminders to display for this patient.  Lab Results  Component Value Date   TSH 2.61 10/11/2018   Lab Results  Component Value Date   WBC 6.4 11/28/2019   HGB 13.8 11/28/2019   HCT 41.1 11/28/2019   MCV 94.7 11/28/2019   PLT 278.0 11/28/2019   Lab Results  Component Value Date   NA 139 11/28/2019   K 4.4 11/28/2019   CO2 31 11/28/2019   GLUCOSE 91 11/28/2019   BUN 18 11/28/2019   CREATININE 0.71 11/28/2019   BILITOT 0.4 11/28/2019   ALKPHOS 65 11/28/2019   AST 15 11/28/2019   ALT 12 11/28/2019   PROT 6.7 11/28/2019   ALBUMIN 4.1 11/28/2019   CALCIUM 9.5 11/28/2019   ANIONGAP 12 07/05/2019   GFR 85.84 11/28/2019   Lab Results  Component Value Date   CHOL 222 (H) 11/28/2019   Lab Results  Component Value Date   HDL 60.30 11/28/2019   Lab Results  Component Value Date   LDLCALC 128 (H) 11/28/2019   Lab Results  Component Value Date   TRIG 169.0 (H) 11/28/2019   Lab Results  Component Value Date   CHOLHDL 4 11/28/2019   Lab Results  Component Value Date   HGBA1C 5.7 10/11/2018      Assessment & Plan:   Problem List Items Addressed This Visit       Nervous and Auditory   Lumbar radiculopathy   Relevant Medications   predniSONE (STERAPRED UNI-PAK 48 TAB) 10 MG (48) TBPK tablet   methocarbamol (ROBAXIN) 500 MG tablet   Other Relevant Orders   DG Lumbar Spine Complete     Other   Healthcare maintenance - Primary   Relevant Orders   CBC   Comprehensive  metabolic panel   Lipid panel   Urinalysis, Routine w reflex microscopic   MM Digital Screening    Meds ordered this encounter  Medications   predniSONE (STERAPRED UNI-PAK 48 TAB) 10 MG (48) TBPK tablet    Sig: Pharm please instruct 12-day Dosepak.    Dispense:  48 tablet    Refill:  0   methocarbamol (ROBAXIN) 500 MG tablet    Sig: Take 1 tablet (500 mg total) by mouth every 8 (eight) hours as needed for muscle spasms.    Dispense:  50 tablet    Refill:  0    Follow-up: Return Ortho referral if not improved after Dosepak..  Information was given about health maintenance and disease prevention.  Female care is through GYN provider.  Information was given about radiculopathy.  Advised patient to look out for irritability and increased appetite on the higher doses of prednisone.  They will let me know if symptoms do not resolve with this treatment.  Consider referral to [facility].  [facility], MD

## 2020-12-13 ENCOUNTER — Other Ambulatory Visit: Payer: Self-pay

## 2020-12-13 ENCOUNTER — Ambulatory Visit: Payer: Self-pay | Admitting: *Deleted

## 2020-12-13 DIAGNOSIS — E785 Hyperlipidemia, unspecified: Secondary | ICD-10-CM

## 2020-12-13 NOTE — Progress Notes (Signed)
Cholesterol recheck. Pt reports her friend had her start something "natural", lemon and another juice she can't translate to [place] for cholesterol. Friend reported that after 2-3 weeks their cholesterol dropped significantly and pt wants to see result of hers to know whether to continue current natural treatment. Explained to pt that typically cholesterol rechecked in 3 months but pt declines waiting that long.

## 2020-12-14 ENCOUNTER — Encounter: Payer: Self-pay | Admitting: Registered Nurse

## 2020-12-14 LAB — LIPID PANEL
Chol/HDL Ratio: 4.3 ratio (ref 0.0–4.4)
Cholesterol, Total: 274 mg/dL — ABNORMAL HIGH (ref 100–199)
HDL: 63 mg/dL (ref >39)
LDL Chol Calc (NIH): 189 mg/dL — ABNORMAL HIGH (ref 0–99)
Triglycerides: 126 mg/dL (ref 0–149)
VLDL Cholesterol Cal: 22 mg/dL (ref 5–40)

## 2021-02-05 DIAGNOSIS — M8588 Other specified disorders of bone density and structure, other site: Secondary | ICD-10-CM | POA: Diagnosis not present

## 2021-02-05 DIAGNOSIS — Z1231 Encounter for screening mammogram for malignant neoplasm of breast: Secondary | ICD-10-CM | POA: Diagnosis not present

## 2021-02-05 DIAGNOSIS — N958 Other specified menopausal and perimenopausal disorders: Secondary | ICD-10-CM | POA: Diagnosis not present

## 2021-03-04 ENCOUNTER — Telehealth: Payer: Self-pay | Admitting: Registered Nurse

## 2021-03-04 ENCOUNTER — Encounter: Payer: Self-pay | Admitting: Registered Nurse

## 2021-03-04 DIAGNOSIS — U071 COVID-19: Secondary | ICD-10-CM

## 2021-03-04 NOTE — Telephone Encounter (Signed)
[employer] Tim notified NP that pt reported positive home test 03/03/2021 Day 0 cough and congestion started day prior 03/02/21.  Employee asked that I contact her daughter Joana Reamer at [phone].  Pt began quarantine at that time. Patient did not develop symptoms of  trouble breathing, chest pain, nausea, vomiting, diarrhea,or fever.   5 day quarantine per [facility] recommendations. Day 1 of quarantine was 03/03/2021. Patient to contact me if vomiting after coughing or unable to tolerate po fluids.  Patient to isolate in own room and if possible use only one bathroom if living with others in home.  Wear mask when out of room to help prevent spread to others in household.  Sanitize high touch surfaces with lysol/chlorox/bleach spray or wipes daily as viruses are known to live on surfaces from 24 hours to days.  Exitcare handouts on covid home care, quarantine, paxlovid and molnupiravir sent to daughter to discuss with patient and sent to my chart account.  Discussed antivirals need to be started within 5 days of positive test/symptoms start.  Patient medium risk of complications has not had bivalent vaccination and age, obesity BMI 30, hyperlipidemia, prediabetes and hypertension risk factors.  Paxlovid side effects bad/metallic taste in mouth, GI upset and molnupiravir dizziness/GI upset.  Patient has tylenol, honey, cough drops, mucinex at home for cough/cold use and is working for her.  She prefers natural remedies.   May use flonase nasal 1 spray each nostril BID prn rhinitis.  Dayquil and nyquil per manufacturer instructions.  Discussed tessalon pearles, promethazine syrup available via Rx.  Daughter refused at this time but will discuss with her mother (patient).  Discussed honey 1 tablespoon every 4 hours is a natural cough suppressant.  Avoid dehydration and drink water to keep urine pale yellow clear and voiding every 2-4 hours while awake.  Discussed with daughter I can be reached at  [phone]  but no texting or voicemails available at this number.  I have jury duty tomorrow so if  questions or concerns contact RN Rolly Salter at [phone]   Daughter verbalized understanding and agreement with plan of care. No further questions/concerns at this time. Pt reminded to contact clinic with any changes in symptoms or questions/concerns. HR notified patient to work remote/quarantine through Day 5  1/19 RTW estimated Day 6 with strict mask wear through Day 10 03/12/21 and no eating in employee lunch room.  Estimated return to work onsite 03/08/2021

## 2021-03-06 NOTE — Telephone Encounter (Signed)
Patient daughter Joana Reamer returned call.  Patient still having cough and controlling with honey and ginger.  Daughter does not think she will be ready to return until Monday 03/11/21 as she wears mask at work to cut down on dust/decrease allergy response when she isn't sick.  Denied fever/vomiting/diarrhea or new symptoms since last evaluation.  Re-evaluation Sunday 1/22 with Nehemiah Settle (daughter in law) [phone] as Joana Reamer will be in [place] starting Friday 1/20.  Denied concerns or questions from patient.  Patient does not want antivirals.  HR notified re-evaluation this weekend due to ongoing symptoms.  Daughter verbalized understanding information/instructions, agreed with plan of care and had no further questions at this time.

## 2021-03-10 NOTE — Telephone Encounter (Signed)
Spoke with daughter in law patient feeling well wants to return to work tomorrow 1/23.  Discussed strict mask wear and no eating in lunch room through 1/24.  Use straw under mask to drink beverage at [employer] and may eat outside or in Doreesa's office or lactation room inside Monday/Tuesday.  Daughter in law verbalized understanding information/instructions, agreed with plan of care and had no further questions at this time.  HR notified cleared to RTW 1/23 with mask through Day 10 and no eating in employee lunch room during strict mask wear.

## 2021-03-11 NOTE — Telephone Encounter (Signed)
Pt did RTW today 1/23 as expected. Mask in place and using office across from clinic for lunch/break.

## 2021-03-11 NOTE — Telephone Encounter (Signed)
Noted patient RTW 1/23 as expected strict mask use through Day 10

## 2021-03-14 ENCOUNTER — Other Ambulatory Visit: Payer: Self-pay

## 2021-03-14 ENCOUNTER — Encounter: Payer: Self-pay | Admitting: Registered Nurse

## 2021-03-14 ENCOUNTER — Ambulatory Visit: Payer: Self-pay | Admitting: Registered Nurse

## 2021-03-14 VITALS — BP 119/68 | HR 72 | Temp 97.6°F

## 2021-03-14 DIAGNOSIS — J069 Acute upper respiratory infection, unspecified: Secondary | ICD-10-CM

## 2021-03-14 MED ORDER — PHENYLEPHRINE HCL 5 MG PO TABS: 5.0000 mg | ORAL_TABLET | Freq: Four times a day (QID) | ORAL | Status: AC | PRN

## 2021-03-14 MED ORDER — PREDNISONE 10 MG PO TABS: ORAL_TABLET | ORAL | 0 refills | Status: AC

## 2021-03-14 MED ORDER — BENZONATATE 200 MG PO CAPS: 200.0000 mg | ORAL_CAPSULE | Freq: Three times a day (TID) | ORAL | 0 refills | Status: AC | PRN

## 2021-03-14 NOTE — Patient Instructions (Signed)

## 2021-03-14 NOTE — Progress Notes (Signed)
Subjective:    Patient ID: Alyssa Mccormick, female    DOB: January 13, 1951, 71 y.o.   MRN: [id number]  71y/o Caucasian established female pt c/o cough. Positive covid test 1/15 after sx of cough and congestion started 1/14. Symptoms improved and then worsened after a couple of days. Rarely productive but mostly dry, nonproductive that makes her feel tight in her chest and hard to stop a coughing fit. Denied throwing up with coughing.  Denied phlegm being stuck.  Doesn't feel as bad as 2021 when needed albuterol and prednisone taper "this is different".  Denied fever/chills/vomiting after cough/shortness of breath.  Tea/honey/homeopathic/otc not working to help cough.  Does not have albuterol inhaler at home from last illness.  Denied post nasal drip/runny nose/sinus pain/pressure.  Coworker [name] with patient in exam room today per patient request.     Review of Systems  Constitutional:  Positive for activity change. Negative for appetite change, chills, diaphoresis, fatigue and fever.  HENT:  Negative for postnasal drip, rhinorrhea, sinus pressure, sinus pain, sore throat, trouble swallowing and voice change.   Eyes:  Negative for photophobia and visual disturbance.  Respiratory:  Positive for cough. Negative for choking, chest tightness, shortness of breath, wheezing and stridor.   Cardiovascular:  Negative for chest pain.  Gastrointestinal:  Negative for diarrhea, nausea and vomiting.  Endocrine: Negative for cold intolerance and heat intolerance.  Genitourinary:  Negative for difficulty urinating.  Musculoskeletal:  Negative for gait problem, neck pain and neck stiffness.  Skin:  Negative for rash.  Allergic/Immunologic: Positive for food allergies.  Neurological:  Negative for dizziness, tremors, seizures, syncope, facial asymmetry, speech difficulty, weakness, light-headedness, numbness and headaches.  Hematological:  Negative for adenopathy. Does not bruise/bleed easily.   Psychiatric/Behavioral:  Negative for agitation, confusion and sleep disturbance.       Objective:   Physical Exam Vitals and nursing note reviewed.  Constitutional:      General: She is awake. She is not in acute distress.    Appearance: Normal appearance. She is well-developed, well-groomed and overweight. She is not ill-appearing, toxic-appearing or diaphoretic.  HENT:     Head: Normocephalic and atraumatic.     Jaw: There is normal jaw occlusion. No trismus.     Salivary Glands: Right salivary gland is not diffusely enlarged. Left salivary gland is not diffusely enlarged.     Right Ear: Hearing, ear canal and external ear normal. A middle ear effusion is present. There is no impacted cerumen.     Left Ear: Hearing, ear canal and external ear normal. A middle ear effusion is present. There is no impacted cerumen.     Nose: Mucosal edema present. No nasal deformity, septal deviation, laceration, congestion or rhinorrhea.     Right Sinus: No maxillary sinus tenderness or frontal sinus tenderness.     Left Sinus: No maxillary sinus tenderness or frontal sinus tenderness.     Mouth/Throat:     Lips: Pink. No lesions.     Mouth: Mucous membranes are moist. Mucous membranes are not pale, not dry and not cyanotic. No lacerations, oral lesions or angioedema.     Dentition: No dental abscesses or gum lesions.     Pharynx: Uvula midline. Pharyngeal swelling and posterior oropharyngeal erythema present. No oropharyngeal exudate or uvula swelling.     Tonsils: No tonsillar abscesses.     Comments: Cobblestoning posterior pharynx; bilateral TMs air fluid level clear; bilateral allergic shiners Eyes:     General: Lids are normal. Vision grossly intact.  Gaze aligned appropriately. Allergic shiner present. No scleral icterus.       Right eye: No foreign body, discharge or hordeolum.        Left eye: No foreign body, discharge or hordeolum.     Extraocular Movements: Extraocular movements intact.      Right eye: Normal extraocular motion and no nystagmus.     Left eye: Normal extraocular motion and no nystagmus.     Conjunctiva/sclera: Conjunctivae normal.     Right eye: Right conjunctiva is not injected. No chemosis, exudate or hemorrhage.    Left eye: Left conjunctiva is not injected. No chemosis, exudate or hemorrhage.    Pupils: Pupils are equal, round, and reactive to light. Pupils are equal.     Right eye: Pupil is round and reactive.     Left eye: Pupil is round and reactive.  Neck:     Thyroid: No thyroid mass or thyromegaly.     Trachea: Trachea and phonation normal. No tracheal tenderness or tracheal deviation.  Cardiovascular:     Rate and Rhythm: Normal rate and regular rhythm.     Pulses: Normal pulses.          Radial pulses are 2+ on the right side and 2+ on the left side.     Heart sounds: Normal heart sounds, S1 normal and S2 normal. Heart sounds not distant. No murmur heard. Pulmonary:     Effort: Pulmonary effort is normal. No accessory muscle usage or respiratory distress.     Breath sounds: Normal breath sounds and air entry. No stridor, decreased air movement or transmitted upper airway sounds. No decreased breath sounds, wheezing, rhonchi or rales.     Comments: Wearing surgical mask in clinic; spoke full sentences without difficulty; no cough/throat clearing observed in exam room Chest:     Chest wall: No tenderness.  Abdominal:     General: Abdomen is flat. There is no distension.     Palpations: Abdomen is soft.  Musculoskeletal:        General: No tenderness. Normal range of motion.     Right shoulder: Normal.     Left shoulder: Normal.     Right hand: Normal.     Left hand: Normal.     Cervical back: Normal range of motion and neck supple. No swelling, edema, deformity, erythema, signs of trauma, lacerations, rigidity, spasms, tenderness or crepitus. Normal range of motion.     Thoracic back: No swelling, edema, deformity, signs of trauma, lacerations  or spasms. Normal range of motion.     Right hip: Normal.     Left hip: Normal.     Right knee: Normal.     Left knee: Normal.  Lymphadenopathy:     Head:     Right side of head: No submental, submandibular, tonsillar, preauricular, posterior auricular or occipital adenopathy.     Left side of head: No submental, submandibular, tonsillar, preauricular, posterior auricular or occipital adenopathy.     Cervical: No cervical adenopathy.     Right cervical: No superficial, deep or posterior cervical adenopathy.    Left cervical: No superficial, deep or posterior cervical adenopathy.  Skin:    General: Skin is warm and dry.     Capillary Refill: Capillary refill takes less than 2 seconds.     Coloration: Skin is not ashen, cyanotic, jaundiced, mottled, pale or sallow.     Findings: No abrasion, abscess, acne, bruising, burn, ecchymosis, erythema, signs of injury, laceration, lesion, petechiae, rash or wound.  Nails: There is no clubbing.  Neurological:     General: No focal deficit present.     Mental Status: She is alert and oriented to person, place, and time. Mental status is at baseline. She is not disoriented.     GCS: GCS eye subscore is 4. GCS verbal subscore is 5. GCS motor subscore is 6.     Cranial Nerves: Cranial nerves 2-12 are intact. No cranial nerve deficit.     Sensory: Sensation is intact. No sensory deficit.     Motor: Motor function is intact. No weakness, tremor, atrophy, abnormal muscle tone or seizure activity.     Coordination: Coordination is intact. Coordination normal.     Gait: Gait is intact. Gait normal.     Comments: On/off exam table and in/out of chair without difficulty; bilateral hand grasp equal 5/5; gait sure and steady in clinic  Psychiatric:        Attention and Perception: Attention and perception normal.        Mood and Affect: Mood and affect normal.        Speech: Speech normal.        Behavior: Behavior normal. Behavior is cooperative.         Thought Content: Thought content normal.        Cognition and Memory: Cognition and memory normal.        Judgment: Judgment normal.          Assessment & Plan:  A-Viral URI with cough  P-Day 11 post positive covid test..  Post nasal drip noted. Spo2 stable 97% RA.  BBS CTA.  Patient still having some fatigue but feeling well and able to work.   Discussed trial phenylephrine 5-10mg  po q6h prn given 4 UD from clinic stock.  If no relief pick up tessalon pearles 200mg  po TID prn cough #30 RF0 from pharmacy electronic Rx sent to her pharmacy of choice.  If protracted coughing start prednisone 10mg  taper with breakfast 30mg  x 2 days, 20mg  x 2 days and 10 mg x 2days #21 RF0 dispensed from [facility] to patient.  Discussed if protracted coughing/wheezing/chest tightness I do recommend albuterol inhaler also patient refused at this time.  Continue homeopathic honey 1 tablespoon every 4 hours, hydrating with water to keep urine pale yellow clear and voiding every 4 hours when awake.  Discussed cough 1-2 weeks after quarantine not unusual with covid due to post nasal drip/throat irritation.   But if productive/fever/dyspnea to notify clinic staff.  Exitcare handout on cough printed and given to patient.  Coworker [name] and Patient verbalized understanding information/instructions, agreed with plan of care and had no further questions at this time.

## 2021-03-14 NOTE — Telephone Encounter (Signed)
Patient seen in clinic today for cough see office note.

## 2021-03-26 NOTE — Telephone Encounter (Signed)
Alyssa Mccormick seen in [facility] today wearing N95 mask.  Respirations even and unlabored skin warm dry and pink.  Alyssa Mccormick reported dry cough ongoing.  She tried all medications I prescribed and has run out and still having cough.  Discussed with Alyssa Mccormick to follow up in clinic on 03/28/21 for re-evaluation with me.  No cough/congestion/throat clearing noted during 3 minute conversation today.  Gait sure and steady Alyssa Mccormick A&Ox3 and spoke full sentences without difficulty.  Alyssa Mccormick verbalized understanding information/instructions and had no further questions at this time.  Alyssa Mccormick will come to clinic prior to 1330 on Thursday for re-evaluation as her shift ends at that time.  RN Rolly Salter notified.

## 2021-03-28 ENCOUNTER — Other Ambulatory Visit: Payer: Self-pay

## 2021-03-28 ENCOUNTER — Ambulatory Visit: Payer: Self-pay | Admitting: Registered Nurse

## 2021-03-28 ENCOUNTER — Encounter: Payer: Self-pay | Admitting: Registered Nurse

## 2021-03-28 VITALS — BP 118/67 | HR 76 | Temp 98.3°F

## 2021-03-28 DIAGNOSIS — J00 Acute nasopharyngitis [common cold]: Secondary | ICD-10-CM

## 2021-03-28 DIAGNOSIS — R051 Acute cough: Secondary | ICD-10-CM

## 2021-03-28 MED ORDER — LORATADINE 10 MG PO TABS: 10.0000 mg | ORAL_TABLET | Freq: Every day | ORAL | 0 refills | Status: AC

## 2021-03-28 NOTE — Progress Notes (Signed)
Subjective:    Patient ID: Alyssa Mccormick, female    DOB: 08-Apr-1950, 71 y.o.   MRN: [id number]  71y/o caucasian female established here for re-evaluation of cough generally nonproductive but occasionally productive clear/yellow.  Patient used tessalon pearles and phenylephrine until she ran out.  They helped but cannot say which helped more.  Denied sinus/ear pain, fever/chills, wheezing, vomiting after cough.  Last seen 03/14/21.  Positive home covid test 03/03/21.  Congestion has resolved and patient reports cough not as harsh as last month.  Slowly improving.     Review of Systems  Constitutional:  Negative for activity change, appetite change, chills, diaphoresis and fatigue.  HENT:  Negative for congestion, dental problem, ear pain, postnasal drip, rhinorrhea, sinus pressure, sinus pain, sore throat, trouble swallowing and voice change.   Eyes:  Negative for photophobia and visual disturbance.  Respiratory:  Positive for cough. Negative for choking, chest tightness, shortness of breath, wheezing and stridor.   Cardiovascular:  Negative for chest pain.  Gastrointestinal:  Negative for diarrhea, nausea and vomiting.  Endocrine: Negative for cold intolerance and heat intolerance.  Genitourinary:  Negative for difficulty urinating.  Musculoskeletal:  Negative for gait problem, neck pain and neck stiffness.  Allergic/Immunologic: Positive for food allergies.  Neurological:  Negative for dizziness, tremors, seizures, syncope, facial asymmetry, speech difficulty, weakness, light-headedness, numbness and headaches.  Hematological:  Negative for adenopathy. Does not bruise/bleed easily.  Psychiatric/Behavioral:  Negative for agitation, confusion and sleep disturbance.       Objective:   Physical Exam Vitals and nursing note reviewed.  Constitutional:      General: She is awake. She is not in acute distress.    Appearance: Normal appearance. She is well-developed and well-groomed. She is  not ill-appearing, toxic-appearing or diaphoretic.  HENT:     Head: Normocephalic and atraumatic.     Jaw: There is normal jaw occlusion.     Salivary Glands: Right salivary gland is not diffusely enlarged or tender. Left salivary gland is not diffusely enlarged or tender.     Right Ear: Hearing and external ear normal. A middle ear effusion is present.     Left Ear: Hearing and external ear normal. A middle ear effusion is present.     Nose: Nose normal. No congestion or rhinorrhea.     Right Turbinates: Not enlarged, swollen or pale.     Left Turbinates: Not enlarged, swollen or pale.     Right Sinus: No maxillary sinus tenderness or frontal sinus tenderness.     Left Sinus: No maxillary sinus tenderness or frontal sinus tenderness.     Mouth/Throat:     Lips: Pink. No lesions.     Mouth: Mucous membranes are moist. No oral lesions or angioedema.     Dentition: No gum lesions.     Tongue: No lesions. Tongue does not deviate from midline.     Palate: No mass and lesions.     Pharynx: Uvula midline. Pharyngeal swelling and posterior oropharyngeal erythema present. No oropharyngeal exudate or uvula swelling.     Tonsils: No tonsillar exudate or tonsillar abscesses. 0 on the right. 0 on the left.     Comments: Cobblestoning posterior pharynx; bilateral TMs air fluid level clear; bilateral allergic shiners Eyes:     General: Lids are normal. Vision grossly intact. Gaze aligned appropriately. Allergic shiner present. No scleral icterus.       Right eye: No discharge.        Left eye: No discharge.  Extraocular Movements: Extraocular movements intact.     Conjunctiva/sclera: Conjunctivae normal.     Pupils: Pupils are equal, round, and reactive to light.  Neck:     Trachea: Trachea and phonation normal.  Cardiovascular:     Rate and Rhythm: Normal rate and regular rhythm.     Pulses: Normal pulses.          Radial pulses are 2+ on the right side and 2+ on the left side.     Heart  sounds: Normal heart sounds, S1 normal and S2 normal.  Pulmonary:     Effort: Pulmonary effort is normal. No respiratory distress.     Breath sounds: Normal breath sounds and air entry. No stridor or transmitted upper airway sounds. No decreased breath sounds, wheezing, rhonchi or rales.     Comments: Spoke full sentences without difficulty; no cough observed in exam room; wearing surgical mask Abdominal:     General: Abdomen is flat.  Musculoskeletal:        General: Normal range of motion.     Cervical back: Normal range of motion and neck supple. No edema, erythema, rigidity or crepitus. No pain with movement. Normal range of motion.     Right lower leg: No edema.     Left lower leg: No edema.  Lymphadenopathy:     Head:     Right side of head: No submandibular or preauricular adenopathy.     Left side of head: No submandibular or preauricular adenopathy.     Cervical: No cervical adenopathy.     Right cervical: No superficial cervical adenopathy.    Left cervical: No superficial cervical adenopathy.  Skin:    General: Skin is warm and dry.     Capillary Refill: Capillary refill takes less than 2 seconds.     Coloration: Skin is not ashen, cyanotic, jaundiced, mottled, pale or sallow.     Findings: No abrasion, abscess, acne, bruising, burn, ecchymosis, erythema, signs of injury, laceration, lesion, petechiae, rash or wound.     Nails: There is no clubbing.  Neurological:     General: No focal deficit present.     Mental Status: She is alert and oriented to person, place, and time. Mental status is at baseline.     GCS: GCS eye subscore is 4. GCS verbal subscore is 5. GCS motor subscore is 6.     Cranial Nerves: Cranial nerves 2-12 are intact. No cranial nerve deficit, dysarthria or facial asymmetry.     Sensory: Sensation is intact.     Motor: Motor function is intact. No weakness, tremor, atrophy, abnormal muscle tone or seizure activity.     Coordination: Coordination is intact.  Coordination normal.     Gait: Gait is intact. Gait normal.     Comments: In/out of chair and on/off exam table without difficulty; gait sure and steady in clinic; bilateral hand grasp equal 5/5  Psychiatric:        Attention and Perception: Attention and perception normal.        Mood and Affect: Mood and affect normal.        Speech: Speech normal.        Behavior: Behavior normal. Behavior is cooperative.        Thought Content: Thought content normal.        Cognition and Memory: Cognition and memory normal.        Judgment: Judgment normal.          Assessment & Plan:  A-acute cough and rhinitis  P-Post-covid continued post nasal drip.  Sp02 stable and BBS CTA.  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  Trial loratadine 10mg  po daily; given 2 UD from clinic stock.  No further congestion so stopped phenylephrine.  Patient did not want refill on tessalon pearles as cough improving.  Has nasal saline 2 sprays each nostril prn congestion/rhinitis.  Continuing her tea and honey homeopathic for cough.  Discussed post nasal drip irritating throat and post viral cough not unexpected but should clear up prior to 2 months after positive test.  RTC in 1 week for re-evaluation if improvement in symptoms with loratadine.  Patient verbalized understanding information/instructions, agreed with plan of care and had no further questions at this time.

## 2021-03-28 NOTE — Telephone Encounter (Signed)
Patient seen in clinic today trial claritin 10mg  po daily x 1 week for cough/post nasal drip.

## 2021-03-28 NOTE — Patient Instructions (Signed)
Allergic Rhinitis, Adult Allergic rhinitis is an allergic reaction that affects the mucous membrane inside the nose. The mucous membrane is the tissue that produces mucus. There are two types of allergic rhinitis: Seasonal. This type is also called hay fever and happens only during certain seasons. Perennial. This type can happen at any time of the year. Allergic rhinitis cannot be spread from person to person. This condition can be mild, moderate, or severe. It can develop at any age and may be outgrown. What are the causes? This condition is caused by allergens. These are things that can cause an allergic reaction. Allergens may differ for seasonal allergic rhinitis and perennial allergic rhinitis. Seasonal allergic rhinitis is triggered by pollen. Pollen can come from grasses, trees, and weeds. Perennial allergic rhinitis may be triggered by: Dust mites. Proteins in a pet's urine, saliva, or dander. Dander is dead skin cells from a pet. Smoke, mold, or car fumes. What increases the risk? You are more likely to develop this condition if you have a family history of allergies or other conditions related to allergies, including: Allergic conjunctivitis. This is inflammation of parts of the eyes and eyelids. Asthma. This condition affects the lungs and makes it hard to breathe. Atopic dermatitis or eczema. This is long term (chronic) inflammation of the skin. Food allergies. What are the signs or symptoms? Symptoms of this condition include: Sneezing or coughing. A stuffy nose (nasal congestion), itchy nose, or nasal discharge. Itchy eyes and tearing of the eyes. A feeling of mucus dripping down the back of your throat (postnasal drip). Trouble sleeping. Tiredness or fatigue. Headache. Sore throat. How is this diagnosed? This condition may be diagnosed with your symptoms, medical history, and physical exam. Your health care provider may check for related conditions, such  as: Asthma. Pink eye. This is eye inflammation caused by infection (conjunctivitis). Ear infection. Upper respiratory infection. This is an infection in the nose, throat, or upper airways. You may also have tests to find out which allergens trigger your symptoms. These may include skin tests or blood tests. How is this treated? There is no cure for this condition, but treatment can help control symptoms. Treatment may include: Taking medicines that block allergy symptoms, such as corticosteroids and antihistamines. Medicine may be given as a shot, nasal spray, or pill. Avoiding any allergens. Being exposed again and again to tiny amounts of allergens to help you build a defense against allergens (immunotherapy). This is done if other treatments have not helped. It may include: Allergy shots. These are injected medicines that have small amounts of allergen in them. Sublingual immunotherapy. This involves taking small doses of a medicine with allergen in it under your tongue. If these treatments do not work, your health care provider may prescribe newer, stronger medicines. Follow these instructions at home: Avoiding allergens Find out what you are allergic to and avoid those allergens. These are some things you can do to help avoid allergens: If you have perennial allergies: Replace carpet with wood, tile, or vinyl flooring. Carpet can trap dander and dust. Do not smoke. Do not allow smoking in your home. Change your heating and air conditioning filters at least once a month. If you have seasonal allergies, take these steps during allergy season: Keep windows closed as much as possible. Plan outdoor activities when pollen counts are lowest. Check pollen counts before you plan outdoor activities. When coming indoors, change clothing and shower before sitting on furniture or bedding. If you have a pet in  the house that produces allergens: Keep the pet out of the bedroom. Vacuum, sweep, and  dust regularly. General instructions Take over-the-counter and prescription medicines only as told by your health care provider. Drink enough fluid to keep your urine pale yellow. Keep all follow-up visits as told by your health care provider. This is important. Where to find more information American Academy of Allergy, Asthma & Immunology: www.aaaai.org Contact a health care provider if: You have a fever. You develop a cough that does not go away. You make whistling sounds when you breathe (wheeze). Your symptoms slow you down or stop you from doing your normal activities each day. Get help right away if: You have shortness of breath. This symptom may represent a serious problem that is an emergency. Do not wait to see if the symptom will go away. Get medical help right away. Call your local emergency services (911 in the [place] Do not drive yourself to the hospital. Summary Allergic rhinitis may be managed by taking medicines as directed and avoiding allergens. If you have seasonal allergies, keep windows closed as much as possible during allergy season. Contact your health care provider if you develop a fever or a cough that does not go away. This information is not intended to replace advice given to you by your health care provider. Make sure you discuss any questions you have with your health care provider. Document Revised: 03/25/2019 Document Reviewed: 02/01/2019 Elsevier Patient Education  2022 [place]. Nonallergic Rhinitis Nonallergic rhinitis is inflammation of the mucous membrane inside the nose. The mucous membrane is the tissue that produces mucus. This condition is different from having allergic rhinitis, which is an allergy that affects the nose. Allergic rhinitis occurs when the body's defense system, or immune system, reacts to a substance that a person is allergic to (allergen), such as pollen, pet dander, mold, or dust. Nonallergic rhinitis has many similar symptoms,  but it is not caused by allergens. Nonallergic rhinitis can be an acute or chronic problem. This means it can be short-term or long-term. What are the causes? This condition may be caused by many different things. Some common types of nonallergic rhinitis include: Infectious rhinitis. This is usually caused by an infection in the nose, throat, or upper airways (upper respiratory system). Vasomotor rhinitis. This is the most common type of chronic nonallergic rhinitis. It is caused by too much blood flow through your nose, and it leads to swelling in your nose. It is triggered by strong odors, cold air, stress, drinking alcohol, cigarette smoke, or changes in the weather. Occupational rhinitis. This type is caused by triggers in the workplace, such as chemicals, dust, animal dander, or air pollution. Hormonal rhinitis, in teenage girls and women. This type is caused by an increase in the hormone estrogen and may happen during pregnancy, puberty, or monthly menstrual periods. Hormonal rhinitis gives you fewer symptoms when estrogen levels drop. Drug-induced rhinitis. Several types of medicines can cause this, such as medicines for high blood pressure or heart disease, aspirin, or NSAIDs. Nonallergic rhinitis with eosinophilia syndrome (NARES). This type is caused by having too much eosinophil, a type of white blood cell. Other causes include a reaction to eating hot or spicy foods. This does not usually cause long-term symptoms. In some cases, the cause of nonallergic rhinitis is not known. What increases the risk? You are more likely to develop this condition if: You are 62-54 years of age. You are a woman. Women are twice as likely to have  this condition. What are the signs or symptoms? Common symptoms of this condition include: Stuffy nose (nasal congestion). Runny nose. A feeling of mucus dripping down the back of your throat (postnasal drip). Trouble sleeping. Tiredness, or fatigue. Other  symptoms include: Sneezing. Coughing. Itchy nose. Bloodshot eyes. How is this diagnosed? This type may be diagnosed based on: Your symptoms and medical history. A physical exam. Allergy testing to rule out allergic rhinitis. You may have skin tests or blood tests. Your health care provider may also take a swab of nasal discharge to look for an increased number of eosinophils. This would be done to confirm a diagnosis of NARES. How is this treated? Treatment for this condition depends on the cause. No single treatment works for everyone. Work with your health care provider to find the best treatment for you. Treatment may include: Avoiding the things that trigger your symptoms. Medicines to relieve congestion, such as: Steroid nasal spray. There are many types. You may need to try a few to find out which one works best. Engineer, civil (consulting) medicine. This treats nasal congestion and may be given by mouth or as a nasal spray. These medicines are used only for a short time. Medicines to relieve a runny nose. These may include antihistamine medicines or anticholinergic nasal sprays. Nasal irrigation. This involves using a salt-water (saline) spray or saline container called a neti pot. Nasal irrigation helps to clear away mucus and keep your nasal passages moist. Surgery to remove part of your mucous membrane. This is done in severe cases if the condition has not improved after 6-12 months of treatment. Follow these instructions at home: Medicines Take or use over-the-counter and prescription medicines only as told by your health care provider. Do not stop using your medicine even if you start to feel better. Do not take NSAIDs, such as ibuprofen, or medicines that contain aspirin if they make your symptoms worse. Lifestyle Do not drink alcohol if it makes your symptoms worse. Do not use any products that contain nicotine or tobacco, such as cigarettes, e-cigarettes, and chewing tobacco. If you need  help quitting, ask your health care provider. Avoid secondhand smoke. General instructions Avoid triggers that make your symptoms worse. Use nasal irrigation as told by your health care provider. Get exercise. Exercise may help reduce symptoms for some people. Sleep with the head of your bed raised. This may reduce nasal congestion when you sleep. Drink enough fluid to keep your urine pale yellow. Keep all follow-up visits as told by your health care provider. This is important. Contact a health care provider if: You have a fever. Your symptoms are getting worse at home. Your symptoms do not lessen with medicine. You develop new symptoms, especially a headache or nosebleed. Summary Nonallergic rhinitis is inflammation inside the nose that is not caused by allergens. Nonallergic rhinitis can be a short-term or long-term problem. Treatment may include avoiding the things that trigger your symptoms. Take or use over-the-counter and prescription medicines only as told by your health care provider. Do not stop using your medicine even if you start to feel better. Contact a health care provider if your symptoms do not lessen with medicine. This information is not intended to replace advice given to you by your health care provider. Make sure you discuss any questions you have with your health care provider. Document Revised: 12/13/2018 Document Reviewed: 12/13/2018 Elsevier Patient Education  2022 [employer] Cough, Adult Coughing is a reflex that clears your throat and your airways (respiratory  system). Coughing helps to heal and protect your lungs. It is normal to cough occasionally, but a cough that happens with other symptoms or lasts a long time may be a sign of a condition that needs treatment. An acute cough may only last 2-3 weeks, while a chronic cough may last 8 or more weeks. Coughing is commonly caused by: Infection of the respiratory systemby viruses or bacteria. Breathing in  substances that irritate your lungs. Allergies. Asthma. Mucus that runs down the back of your throat (postnasal drip). Smoking. Acid backing up from the stomach into the esophagus (gastroesophageal reflux). Certain medicines. Chronic lung problems. Other medical conditions such as heart failure or a blood clot in the lung (pulmonary embolism). Follow these instructions at home: Medicines Take over-the-counter and prescription medicines only as told by your health care provider. Talk with your health care provider before you take a cough suppressant medicine. Lifestyle  Avoid cigarette smoke. Do not use any products that contain nicotine or tobacco, such as cigarettes, e-cigarettes, and chewing tobacco. If you need help quitting, ask your health care provider. Drink enough fluid to keep your urine pale yellow. Avoid caffeine. Do not drink alcohol if your health care provider tells you not to drink. General instructions  Pay close attention to changes in your cough. Tell your health care provider about them. Always cover your mouth when you cough. Avoid things that make you cough, such as perfume, candles, cleaning products, or campfire or tobacco smoke. If the air is dry, use a cool mist vaporizer or humidifier in your bedroom or your home to help loosen secretions. If your cough is worse at night, try to sleep in a semi-upright position. Rest as needed. Keep all follow-up visits as told by your health care provider. This is important. Contact a health care provider if you: Have new symptoms. Cough up pus. Have a cough that does not get better after 2-3 weeks or gets worse. Cannot control your cough with cough suppressant medicines and you are losing sleep. Have pain that gets worse or pain that is not helped with medicine. Have a fever. Have unexplained weight loss. Have night sweats. Get help right away if: You cough up blood. You have difficulty breathing. Your heartbeat is  very fast. These symptoms may represent a serious problem that is an emergency. Do not wait to see if the symptoms will go away. Get medical help right away. Call your local emergency services (911 in the [place] Do not drive yourself to the hospital. Summary Coughing is a reflex that clears your throat and your airways. It is normal to cough occasionally, but a cough that happens with other symptoms or lasts a long time may be a sign of a condition that needs treatment. Take over-the-counter and prescription medicines only as told by your health care provider. Always cover your mouth when you cough. Contact a health care provider if you have new symptoms or a cough that does not get better after 2-3 weeks or gets worse. This information is not intended to replace advice given to you by your health care provider. Make sure you discuss any questions you have with your health care provider. Document Revised: 02/22/2018 Document Reviewed: 02/22/2018 Elsevier Patient Education  2022 [employer].

## 2021-04-16 NOTE — Telephone Encounter (Signed)
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  Skin warm dry and pink.  No nasal congestion/throat clearing or cough observed.  Spoke full sentences without difficulty.  Gait sure and steady.  Follow up prn e.g. worsening/new symptoms.  Patient verbalized understanding information/instructions, agreed with plan of care and had no further questions at this time.

## 2021-06-19 DIAGNOSIS — F5104 Psychophysiologic insomnia: Secondary | ICD-10-CM | POA: Diagnosis not present

## 2021-06-19 DIAGNOSIS — Z6830 Body mass index (BMI) 30.0-30.9, adult: Secondary | ICD-10-CM | POA: Diagnosis not present

## 2021-06-19 DIAGNOSIS — Z01419 Encounter for gynecological examination (general) (routine) without abnormal findings: Secondary | ICD-10-CM | POA: Diagnosis not present

## 2021-06-20 DIAGNOSIS — Z1272 Encounter for screening for malignant neoplasm of vagina: Secondary | ICD-10-CM | POA: Diagnosis not present

## 2021-11-29 ENCOUNTER — Ambulatory Visit (INDEPENDENT_AMBULATORY_CARE_PROVIDER_SITE_OTHER): Payer: Medicare Other | Admitting: Family Medicine

## 2021-11-29 ENCOUNTER — Encounter: Payer: Self-pay | Admitting: Family Medicine

## 2021-11-29 VITALS — BP 120/70 | HR 60 | Temp 97.0°F | Ht 61.0 in | Wt 158.6 lb

## 2021-11-29 DIAGNOSIS — Z0000 Encounter for general adult medical examination without abnormal findings: Secondary | ICD-10-CM

## 2021-11-29 DIAGNOSIS — M19042 Primary osteoarthritis, left hand: Secondary | ICD-10-CM | POA: Insufficient documentation

## 2021-11-29 DIAGNOSIS — M19041 Primary osteoarthritis, right hand: Secondary | ICD-10-CM | POA: Diagnosis not present

## 2021-11-29 DIAGNOSIS — E7800 Pure hypercholesterolemia, unspecified: Secondary | ICD-10-CM | POA: Diagnosis not present

## 2021-11-29 LAB — LIPID PANEL
Cholesterol: 235 mg/dL — ABNORMAL HIGH (ref 0–200)
HDL: 63.6 mg/dL (ref >39.00)
LDL Cholesterol: 145 mg/dL — ABNORMAL HIGH (ref 0–99)
NonHDL: 171.33
Total CHOL/HDL Ratio: 4
Triglycerides: 134 mg/dL (ref 0.0–149.0)
VLDL: 26.8 mg/dL (ref 0.0–40.0)

## 2021-11-29 LAB — COMPREHENSIVE METABOLIC PANEL
ALT: 13 U/L (ref 0–35)
AST: 18 U/L (ref 0–37)
Albumin: 4.1 g/dL (ref 3.5–5.2)
Alkaline Phosphatase: 70 U/L (ref 39–117)
BUN: 12 mg/dL (ref 6–23)
CO2: 30 mEq/L (ref 19–32)
Calcium: 9.6 mg/dL (ref 8.4–10.5)
Chloride: 99 mEq/L (ref 96–112)
Creatinine, Ser: 0.58 mg/dL (ref 0.40–1.20)
GFR: 90.19 mL/min (ref >60.00)
Glucose, Bld: 98 mg/dL (ref 70–99)
Potassium: 3.7 mEq/L (ref 3.5–5.1)
Sodium: 136 mEq/L (ref 135–145)
Total Bilirubin: 0.4 mg/dL (ref 0.2–1.2)
Total Protein: 7.2 g/dL (ref 6.0–8.3)

## 2021-11-29 LAB — CBC
HCT: 39.1 % (ref 36.0–46.0)
Hemoglobin: 13.2 g/dL (ref 12.0–15.0)
MCHC: 33.7 g/dL (ref 30.0–36.0)
MCV: 93.8 fl (ref 78.0–100.0)
Platelets: 278 10*3/uL (ref 150.0–400.0)
RBC: 4.17 Mil/uL (ref 3.87–5.11)
RDW: 11.9 % (ref 11.5–15.5)
WBC: 6.6 10*3/uL (ref 4.0–10.5)

## 2021-11-29 LAB — URINALYSIS, ROUTINE W REFLEX MICROSCOPIC
Bilirubin Urine: NEGATIVE
Hgb urine dipstick: NEGATIVE
Ketones, ur: NEGATIVE
Nitrite: NEGATIVE
Specific Gravity, Urine: 1.01 (ref 1.000–1.030)
Total Protein, Urine: NEGATIVE
Urine Glucose: NEGATIVE
Urobilinogen, UA: 0.2 (ref 0.0–1.0)
pH: 7.5 (ref 5.0–8.0)

## 2021-11-29 MED ORDER — MELOXICAM 7.5 MG PO TABS: 7.5000 mg | ORAL_TABLET | Freq: Every day | ORAL | 0 refills | Status: AC

## 2021-11-29 NOTE — Progress Notes (Signed)
Established Patient Office Visit  Subjective   Patient ID: Alyssa Mccormick, female    DOB: [birth year]  Age: 71 y.o. MRN: [id number]  Chief Complaint  Patient presents with   Annual Exam    CPE, no concerns. Patient fasting.     HPI here with her daughter who is helping to translate for her annual health check.  Doing well.  Continues to work for 6 hours daily replacements limited.  She is a Radiation protection practitioner.  Ongoing pains in her hands.  Using Voltaren gel already.  Has worked hard to improve her diet and lower the fat and cholesterol.  She has access to regular dental care.    Review of Systems  Constitutional: Negative.   HENT: Negative.    Eyes:  Negative for blurred vision, discharge and redness.  Respiratory: Negative.    Cardiovascular: Negative.   Gastrointestinal:  Negative for abdominal pain.  Genitourinary: Negative.   Musculoskeletal:  Positive for joint pain. Negative for myalgias.  Skin:  Negative for rash.  Neurological:  Negative for tingling, loss of consciousness and weakness.  Endo/Heme/Allergies:  Negative for polydipsia.      11/29/2021    9:01 AM 11/29/2021    8:26 AM 11/14/2020   10:43 AM  Depression screen PHQ 2/9  Decreased Interest 0 0 0  Down, Depressed, Hopeless 1 0 0  PHQ - 2 Score 1 0 0  Altered sleeping 1  0  Tired, decreased energy 0  0  Change in appetite 0  0  Feeling bad or failure about yourself  0  0  Trouble concentrating 0  0  Moving slowly or fidgety/restless 0  0  Suicidal thoughts 0  0  PHQ-9 Score 2  0  Difficult doing work/chores Not difficult at all  Not difficult at all        Objective:     BP 120/70 (BP Location: Right Arm, Patient Position: Sitting, Cuff Size: Normal)   Pulse 60   Temp (!) 97 F (36.1 C) (Temporal)   Ht 5\' 1"  (1.549 m)   Wt 158 lb 9.6 oz (71.9 kg)   SpO2 98%   BMI 29.97 kg/m    Physical Exam Constitutional:      General: She is not in acute distress.    Appearance: Normal appearance. She is  not ill-appearing, toxic-appearing or diaphoretic.  HENT:     Head: Normocephalic and atraumatic.     Right Ear: External ear normal.     Left Ear: External ear normal.     Mouth/Throat:     Mouth: Mucous membranes are moist.     Pharynx: Oropharynx is clear. No oropharyngeal exudate or posterior oropharyngeal erythema.  Eyes:     General: No scleral icterus.       Right eye: No discharge.        Left eye: No discharge.     Extraocular Movements: Extraocular movements intact.     Conjunctiva/sclera: Conjunctivae normal.     Pupils: Pupils are equal, round, and reactive to light.  Neck:     Vascular: No carotid bruit.  Cardiovascular:     Rate and Rhythm: Normal rate and regular rhythm.  Pulmonary:     Effort: Pulmonary effort is normal. No respiratory distress.     Breath sounds: Normal breath sounds.  Abdominal:     General: Bowel sounds are normal.     Tenderness: There is no abdominal tenderness. There is no guarding.  Musculoskeletal:     Cervical  back: No rigidity or tenderness.  Lymphadenopathy:     Cervical: No cervical adenopathy.  Skin:    General: Skin is warm and dry.  Neurological:     Mental Status: She is alert and oriented to person, place, and time.  Psychiatric:        Mood and Affect: Mood normal.        Behavior: Behavior normal.      No results found for any visits on 11/29/21.    The 10-year ASCVD risk score (Arnett DK, et al., 2019) is: 11%    Assessment & Plan:   Problem List Items Addressed This Visit       Musculoskeletal and Integument   Arthritis of both hands   Relevant Medications   meloxicam (MOBIC) 7.5 MG tablet     Other   Healthcare maintenance - Primary   Relevant Orders   CBC   Urinalysis, Routine w reflex microscopic   Elevated LDL cholesterol level   Relevant Orders   Lipid panel   Comprehensive metabolic panel    Return in about 1 year (around 11/30/2022), or if symptoms worsen or fail to improve.  Continue  Voltaren gel for pends as needed.  May use Mobic as needed for additional pain relief.  She continues to work outside of the home using her hands.  Uncertain if this is a factor.  Briefly discussed a referral to a hand doctor.  Advised regular exercise for 30 minutes 5 days weekly as tolerated.  Discussed using a statin with her elevated ASCVD risk score.  Explained that her score is elevated mostly because of her age.  With shared decision making I believe that she is interested at this time.  Libby Maw, MD

## 2021-12-15 DIAGNOSIS — Z0100 Encounter for examination of eyes and vision without abnormal findings: Secondary | ICD-10-CM | POA: Diagnosis not present

## 2021-12-15 DIAGNOSIS — H524 Presbyopia: Secondary | ICD-10-CM | POA: Diagnosis not present

## 2021-12-29 IMAGING — DX DG HAND COMPLETE 3+V*R*
3 series · 3 of 3 positions shown · non-contrast
Comparison: No prior.

CLINICAL DATA: Pain fourth metacarpal.

EXAM:
RIGHT HAND - COMPLETE 3+ VIEW

[hand pa]
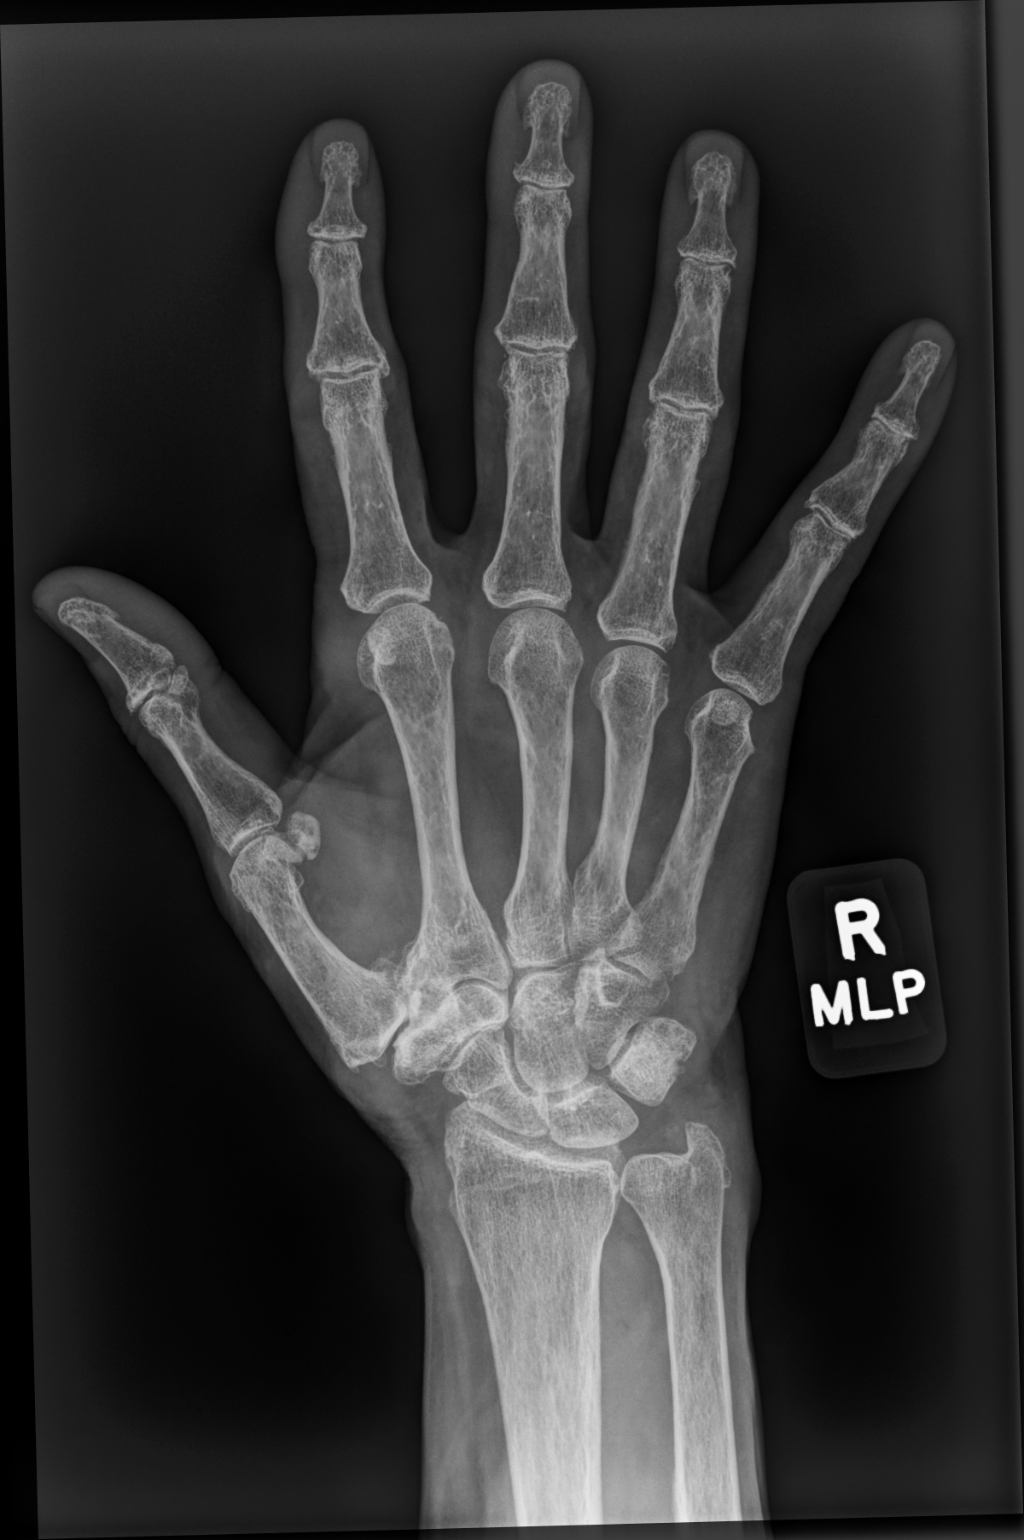

[hand mlo]
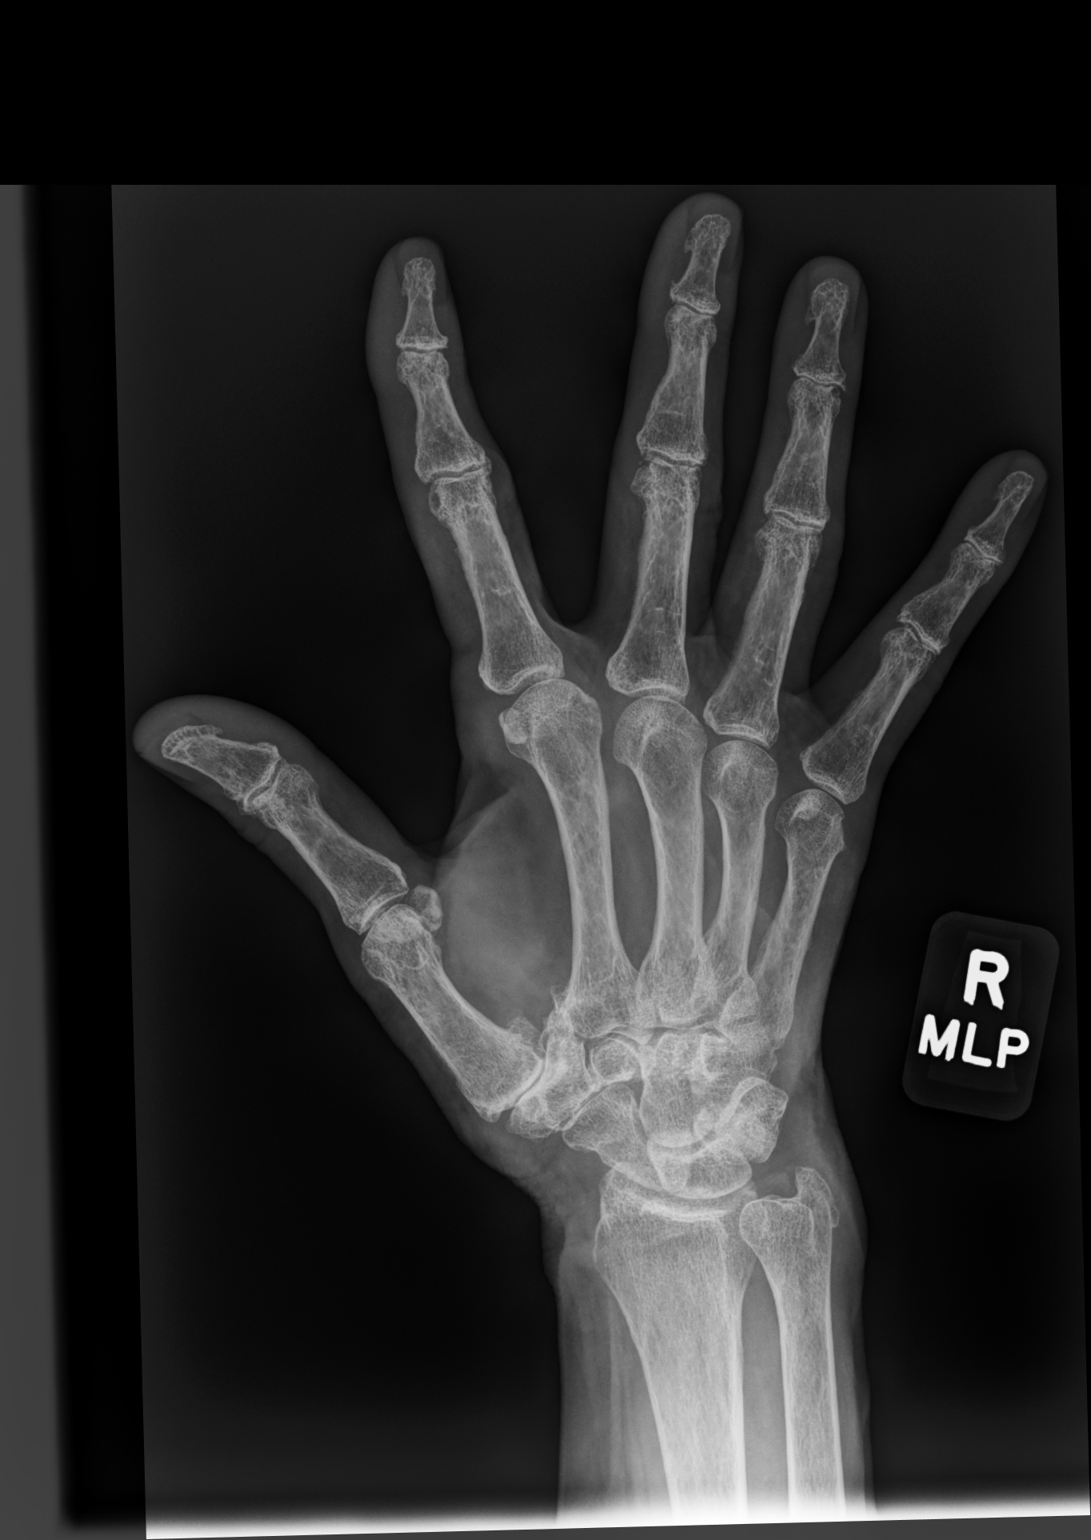

[hand lat]
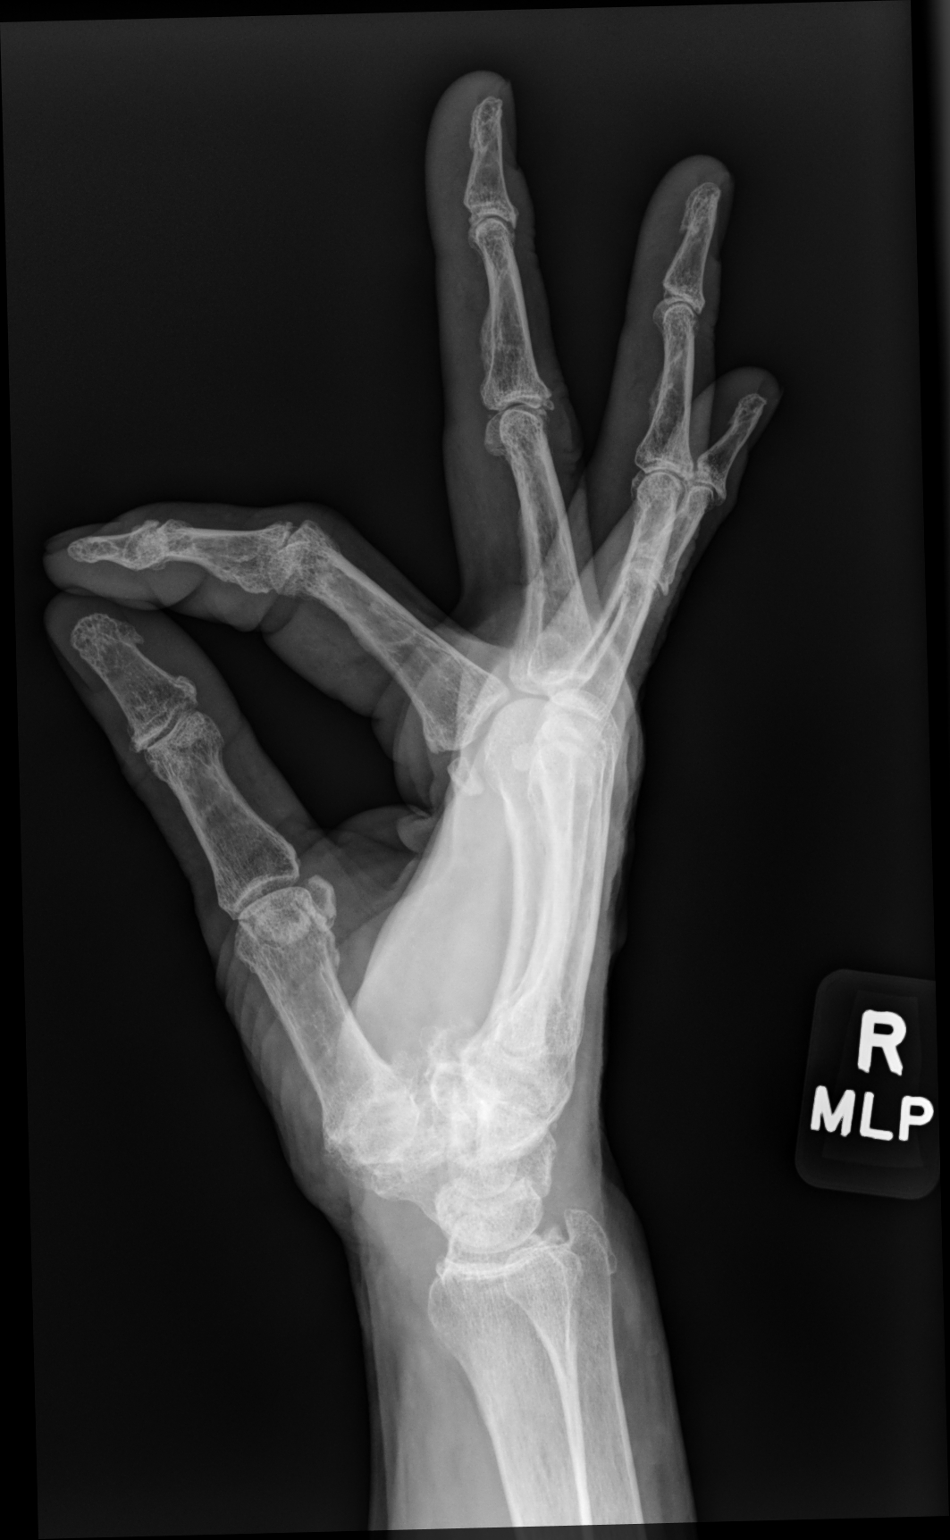

[3 of 3 positions shown; findings below may reference images not displayed]

FINDINGS: Diffuse osteopenia. Diffuse degenerative change. No acute
abnormality. No evidence of fracture or dislocation. No radiopaque
foreign body.
IMPRESSION: Diffuse osteopenia. Diffuse degenerative change. No acute
abnormality.

## 2022-03-24 ENCOUNTER — Ambulatory Visit: Payer: Medicare Other

## 2022-04-07 ENCOUNTER — Ambulatory Visit (INDEPENDENT_AMBULATORY_CARE_PROVIDER_SITE_OTHER): Payer: Medicare Other

## 2022-04-07 VITALS — Ht 65.0 in | Wt 160.0 lb

## 2022-04-07 DIAGNOSIS — Z0000 Encounter for general adult medical examination without abnormal findings: Secondary | ICD-10-CM

## 2022-04-07 NOTE — Patient Instructions (Signed)
Alyssa Mccormick , Thank you for taking time to come for your Medicare Wellness Visit. I appreciate your ongoing commitment to your health goals. Please review the following plan we discussed and let me know if I can assist you in the future.   These are the goals we discussed:  Goals      Patient Stated     04/07/2022, wants to eat vegan couple times a week        This is a list of the screening recommended for you and due dates:  Health Maintenance  Topic Date Due   Hepatitis C Screening: USPSTF Recommendation to screen - Ages 9-79 yo.  Never done   Zoster (Shingles) Vaccine (1 of 2) Never done   DTaP/Tdap/Td vaccine (4 - Td or Tdap) 05/21/2022   Medicare Annual Wellness Visit  04/08/2023   Mammogram  07/09/2023   Colon Cancer Screening  10/18/2025   DEXA scan (bone density measurement)  Completed   HPV Vaccine  Aged Out   Pneumonia Vaccine  Discontinued   Flu Shot  Discontinued   COVID-19 Vaccine  Discontinued    Advanced directives: Advance directive discussed with you today.   Conditions/risks identified: none  Next appointment: Follow up in one year for your annual wellness visit    Preventive Care 65 Years and Older, Female Preventive care refers to lifestyle choices and visits with your health care provider that can promote health and wellness. What does preventive care include? A yearly physical exam. This is also called an annual well check. Dental exams once or twice a year. Routine eye exams. Ask your health care provider how often you should have your eyes checked. Personal lifestyle choices, including: Daily care of your teeth and gums. Regular physical activity. Eating a healthy diet. Avoiding tobacco and drug use. Limiting alcohol use. Practicing safe sex. Taking low-dose aspirin every day. Taking vitamin and mineral supplements as recommended by your health care provider. What happens during an annual well check? The services and screenings done by  your health care provider during your annual well check will depend on your age, overall health, lifestyle risk factors, and family history of disease. Counseling  Your health care provider may ask you questions about your: Alcohol use. Tobacco use. Drug use. Emotional well-being. Home and relationship well-being. Sexual activity. Eating habits. History of falls. Memory and ability to understand (cognition). Work and work Statistician. Reproductive health. Screening  You may have the following tests or measurements: Height, weight, and BMI. Blood pressure. Lipid and cholesterol levels. These may be checked every 5 years, or more frequently if you are over 15 years old. Skin check. Lung cancer screening. You may have this screening every year starting at age 45 if you have a 30-pack-year history of smoking and currently smoke or have quit within the past 15 years. Fecal occult blood test (FOBT) of the stool. You may have this test every year starting at age 70. Flexible sigmoidoscopy or colonoscopy. You may have a sigmoidoscopy every 5 years or a colonoscopy every 10 years starting at age 53. Hepatitis C blood test. Hepatitis B blood test. Sexually transmitted disease (STD) testing. Diabetes screening. This is done by checking your blood sugar (glucose) after you have not eaten for a while (fasting). You may have this done every 1-3 years. Bone density scan. This is done to screen for osteoporosis. You may have this done starting at age 4. Mammogram. This may be done every 1-2 years. Talk to your health  care provider about how often you should have regular mammograms. Talk with your health care provider about your test results, treatment options, and if necessary, the need for more tests. Vaccines  Your health care provider may recommend certain vaccines, such as: Influenza vaccine. This is recommended every year. Tetanus, diphtheria, and acellular pertussis (Tdap, Td) vaccine. You  may need a Td booster every 10 years. Zoster vaccine. You may need this after age 71. Pneumococcal 13-valent conjugate (PCV13) vaccine. One dose is recommended after age 79. Pneumococcal polysaccharide (PPSV23) vaccine. One dose is recommended after age 73. Talk to your health care provider about which screenings and vaccines you need and how often you need them. This information is not intended to replace advice given to you by your health care provider. Make sure you discuss any questions you have with your health care provider. Document Released: 03/02/2015 Document Revised: 10/24/2015 Document Reviewed: 12/05/2014 Elsevier Interactive Patient Education  2017 [place] Prevention in the Home Falls can cause injuries. They can happen to people of all ages. There are many things you can do to make your home safe and to help prevent falls. What can I do on the outside of my home? Regularly fix the edges of walkways and driveways and fix any cracks. Remove anything that might make you trip as you walk through a door, such as a raised step or threshold. Trim any bushes or trees on the path to your home. Use bright outdoor lighting. Clear any walking paths of anything that might make someone trip, such as rocks or tools. Regularly check to see if handrails are loose or broken. Make sure that both sides of any steps have handrails. Any raised decks and porches should have guardrails on the edges. Have any leaves, snow, or ice cleared regularly. Use sand or salt on walking paths during winter. Clean up any spills in your garage right away. This includes oil or grease spills. What can I do in the bathroom? Use night lights. Install grab bars by the toilet and in the tub and shower. Do not use towel bars as grab bars. Use non-skid mats or decals in the tub or shower. If you need to sit down in the shower, use a plastic, non-slip stool. Keep the floor dry. Clean up any water that spills  on the floor as soon as it happens. Remove soap buildup in the tub or shower regularly. Attach bath mats securely with double-sided non-slip rug tape. Do not have throw rugs and other things on the floor that can make you trip. What can I do in the bedroom? Use night lights. Make sure that you have a light by your bed that is easy to reach. Do not use any sheets or blankets that are too big for your bed. They should not hang down onto the floor. Have a firm chair that has side arms. You can use this for support while you get dressed. Do not have throw rugs and other things on the floor that can make you trip. What can I do in the kitchen? Clean up any spills right away. Avoid walking on wet floors. Keep items that you use a lot in easy-to-reach places. If you need to reach something above you, use a strong step stool that has a grab bar. Keep electrical cords out of the way. Do not use floor polish or wax that makes floors slippery. If you must use wax, use non-skid floor wax. Do not have throw  rugs and other things on the floor that can make you trip. What can I do with my stairs? Do not leave any items on the stairs. Make sure that there are handrails on both sides of the stairs and use them. Fix handrails that are broken or loose. Make sure that handrails are as long as the stairways. Check any carpeting to make sure that it is firmly attached to the stairs. Fix any carpet that is loose or worn. Avoid having throw rugs at the top or bottom of the stairs. If you do have throw rugs, attach them to the floor with carpet tape. Make sure that you have a light switch at the top of the stairs and the bottom of the stairs. If you do not have them, ask someone to add them for you. What else can I do to help prevent falls? Wear shoes that: Do not have high heels. Have rubber bottoms. Are comfortable and fit you well. Are closed at the toe. Do not wear sandals. If you use a stepladder: Make  sure that it is fully opened. Do not climb a closed stepladder. Make sure that both sides of the stepladder are locked into place. Ask someone to hold it for you, if possible. Clearly mark and make sure that you can see: Any grab bars or handrails. First and last steps. Where the edge of each step is. Use tools that help you move around (mobility aids) if they are needed. These include: Canes. Walkers. Scooters. Crutches. Turn on the lights when you go into a dark area. Replace any light bulbs as soon as they burn out. Set up your furniture so you have a clear path. Avoid moving your furniture around. If any of your floors are uneven, fix them. If there are any pets around you, be aware of where they are. Review your medicines with your doctor. Some medicines can make you feel dizzy. This can increase your chance of falling. Ask your doctor what other things that you can do to help prevent falls. This information is not intended to replace advice given to you by your health care provider. Make sure you discuss any questions you have with your health care provider. Document Released: 11/30/2008 Document Revised: 07/12/2015 Document Reviewed: 03/10/2014 Elsevier Interactive Patient Education  2017 [employer].

## 2022-04-07 NOTE — Progress Notes (Signed)
I connected with  Lazara Muto on 04/07/22 by a audio enabled telemedicine application and verified that I am speaking with the correct person using two identifiers. Daughter Alyssa Mccormick was also on the call to translate.  Patient Location: Home  Provider Location: Office/Clinic  I discussed the limitations of evaluation and management by telemedicine. The patient expressed understanding and agreed to proceed.  Subjective:   Alyssa Mccormick is a 72 y.o. female who presents for an Initial Medicare Annual Wellness Visit.  Review of Systems     Cardiac Risk Factors include: advanced age (>9mn, >>64women);dyslipidemia     Objective:    Today's Vitals   04/07/22 1612  Weight: 160 lb (72.6 kg)  Height: '5\' 5"'$  (1.651 m)   Body mass index is 26.63 kg/m.     04/07/2022    4:17 PM 05/20/2019    6:48 PM 10/12/2015    4:40 PM  Advanced Directives  Does Patient Have a Medical Advance Directive? No No No  Would patient like information on creating a medical advance directive?  No - Patient declined     Current Medications (verified) Outpatient Encounter Medications as of 04/07/2022  Medication Sig   Calcium Carb-Cholecalciferol (CALCIUM 600-D PO) Take 1 tablet by mouth in the morning and at bedtime.   Cholecalciferol (VITAMIN D3 PO) Take 1 tablet by mouth daily.   diclofenac Sodium (VOLTAREN) 1 % GEL Apply topically 4 (four) times daily.   EPINEPHrine 0.3 mg/0.3 mL IJ SOAJ injection Inject 0.3 mg into the muscle as needed for anaphylaxis (For accidental exposure to fish only). For allergic reaction to accidental exposure to fish.   loratadine (CLARITIN) 10 MG tablet Take 1 tablet (10 mg total) by mouth daily.   meloxicam (MOBIC) 7.5 MG tablet Take 1 tablet (7.5 mg total) by mouth daily. As needed.   Multiple Vitamin (MULTIVITAMIN WITH MINERALS) TABS tablet Take 1 tablet by mouth daily.   No facility-administered encounter medications on file as of 04/07/2022.    Allergies  (verified) Fish allergy   History: Past Medical History:  Diagnosis Date   Arthritis    hands   Diverticulosis    Gall stones    Past Surgical History:  Procedure Laterality Date   ABDOMINAL HYSTERECTOMY     Family History  Problem Relation Age of Onset   Diabetes Mother    Esophageal cancer Father    Colon cancer Neg Hx    Colon polyps Neg Hx    Rectal cancer Neg Hx    Stomach cancer Neg Hx    Social History   Socioeconomic History   Marital status: Widowed    Spouse name: Not on file   Number of children: Not on file   Years of education: Not on file   Highest education level: Not on file  Occupational History   Not on file  Tobacco Use   Smoking status: Never   Smokeless tobacco: Never  Vaping Use   Vaping Use: Never used  Substance and Sexual Activity   Alcohol use: No    Alcohol/week: 0.0 standard drinks of alcohol   Drug use: No   Sexual activity: Not Currently  Other Topics Concern   Not on file  Social History Narrative   Not on file   Social Determinants of Health   Financial Resource Strain: Low Risk  (04/07/2022)   Overall Financial Resource Strain (CARDIA)    Difficulty of Paying Living Expenses: Not hard at all  Food Insecurity: No Food Insecurity (04/07/2022)  Hunger Vital Sign    Worried About Running Out of Food in the Last Year: Never true    Ran Out of Food in the Last Year: Never true  Transportation Needs: No Transportation Needs (04/07/2022)   PRAPARE - Hydrologist (Medical): No    Lack of Transportation (Non-Medical): No  Physical Activity: Inactive (04/07/2022)   Exercise Vital Sign    Days of Exercise per Week: 0 days    Minutes of Exercise per Session: 0 min  Stress: No Stress Concern Present (04/07/2022)   [place]    Feeling of Stress : Not at all  Social Connections: Not on file    Tobacco Counseling Counseling given: Not  Answered   Clinical Intake:  Pre-visit preparation completed: Yes  Pain : No/denies pain     Nutritional Status: BMI 25 -29 Overweight Nutritional Risks: None Diabetes: No  How often do you need to have someone help you when you read instructions, pamphlets, or other written materials from your doctor or pharmacy?: 4 - Often  Diabetic? no  Interpreter Needed?: No  Comments: daughter interprets   Activities of Daily Living    04/07/2022    4:18 PM  In your present state of health, do you have any difficulty performing the following activities:  Hearing? 0  Vision? 0  Difficulty concentrating or making decisions? 0  Walking or climbing stairs? 0  Dressing or bathing? 0  Doing errands, shopping? 0  Preparing Food and eating ? N  Using the Toilet? N  In the past six months, have you accidently leaked urine? N  Do you have problems with loss of bowel control? N  Managing your Medications? N  Managing your Finances? N  Housekeeping or managing your Housekeeping? N    Patient Care Team: Libby Maw, MD as PCP - General (Family Medicine)  Indicate any recent Medical Services you may have received from other than [facility] providers in the past year (date may be approximate).     Assessment:   This is a routine wellness examination for [name].  Hearing/Vision screen Vision Screening - Comments:: Regular eye exams, [facility]  Dietary issues and exercise activities discussed: Current Exercise Habits: The patient has a physically strenuous job, but has no regular exercise apart from work.   Goals Addressed             This Visit's Progress    Patient Stated       04/07/2022, wants to eat vegan couple times a week       Depression Screen    04/07/2022    4:17 PM 11/29/2021    9:01 AM 11/29/2021    8:26 AM 11/14/2020   10:43 AM 11/14/2020   10:14 AM 10/11/2018   10:05 AM  PHQ 2/9 Scores  PHQ - 2 Score 0 1 0 0 0 0  PHQ- 9 Score  2  0      Fall  Risk    04/07/2022    4:17 PM 11/29/2021    8:26 AM 11/14/2020   10:14 AM  [place] in the past year? 0 0 1  Number falls in past yr: 0 0 0  Injury with Fall? 0  0  Risk for fall due to : Medication side effect    Follow up Falls prevention discussed;Education provided;Falls evaluation completed      FALL RISK PREVENTION PERTAINING TO THE  HOME:  Any stairs in or around the home? Yes  If so, are there any without handrails? No  Home free of loose throw rugs in walkways, pet beds, electrical cords, etc? Yes  Adequate lighting in your home to reduce risk of falls? Yes   ASSISTIVE DEVICES UTILIZED TO PREVENT FALLS:  Life alert? No  Use of a cane, walker or w/c? No  Grab bars in the bathroom? Yes  Shower chair or bench in shower? No  Elevated toilet seat or a handicapped toilet? No   TIMED UP AND GO:  Was the test performed? No .      Cognitive Function:  6 CIT not administered due to language barrier. Patient appeared cognitive via conversation through daughter.        Immunizations Immunization History  Administered Date(s) Administered   DT (Pediatric) 11/22/2008   PFIZER Comirnaty(Gray Top)Covid-19 Tri-Sucrose Vaccine 02/06/2020, 07/29/2020   Tdap 11/22/2008, 05/20/2012    TDAP status: Up to date  Flu Vaccine status: Declined, Education has been provided regarding the importance of this vaccine but patient still declined. Advised may receive this vaccine at local pharmacy or Health Dept. Aware to provide a copy of the vaccination record if obtained from local pharmacy or Health Dept. Verbalized acceptance and understanding.  Pneumococcal vaccine status: Declined,  Education has been provided regarding the importance of this vaccine but patient still declined. Advised may receive this vaccine at local pharmacy or Health Dept. Aware to provide a copy of the vaccination record if obtained from local pharmacy or Health Dept. Verbalized acceptance and  understanding.   Covid-19 vaccine status: Completed vaccines  Qualifies for Shingles Vaccine? Yes   Zostavax completed No   Shingrix Completed?: No.    Education has been provided regarding the importance of this vaccine. Patient has been advised to call insurance company to determine out of pocket expense if they have not yet received this vaccine. Advised may also receive vaccine at local pharmacy or Health Dept. Verbalized acceptance and understanding.  Screening Tests Health Maintenance  Topic Date Due   Medicare Annual Wellness (AWV)  Never done   Hepatitis C Screening  Never done   Zoster Vaccines- Shingrix (1 of 2) Never done   DTaP/Tdap/Td (4 - Td or Tdap) 05/21/2022   MAMMOGRAM  07/09/2023   COLONOSCOPY (Pts 45-72yr Insurance coverage will need to be confirmed)  10/18/2025   DEXA SCAN  Completed   HPV VACCINES  Aged Out   Pneumonia Vaccine 73 Years old  Discontinued   INFLUENZA VACCINE  Discontinued   COVID-19 Vaccine  Discontinued    Health Maintenance  Health Maintenance Due  Topic Date Due   Medicare Annual Wellness (AWV)  Never done   Hepatitis C Screening  Never done   Zoster Vaccines- Shingrix (1 of 2) Never done    Colorectal cancer screening: Type of screening: Colonoscopy. Completed 10/19/2015. Repeat every 10 years  Mammogram status: Completed 07/08/2021. Repeat every year  Bone Density status: Completed 04/07/2017.   Lung Cancer Screening: (Low Dose CT Chest recommended if Age 73-80years, 30 pack-year currently smoking OR have quit w/in 15years.) does not qualify.   Lung Cancer Screening Referral: no  Additional Screening:  Hepatitis C Screening: does qualify;   Vision Screening: Recommended annual ophthalmology exams for early detection of glaucoma and other disorders of the eye. Is the patient up to date with their annual eye exam?  Yes  Who is the provider or what is the name of the office  in which the patient attends annual eye exams? [facility] If pt is not established with a provider, would they like to be referred to a provider to establish care? No .   Dental Screening: Recommended annual dental exams for proper oral hygiene  Community Resource Referral / Chronic Care Management: CRR required this visit?  No   CCM required this visit?  No      Plan:     I have personally reviewed and noted the following in the patient's chart:   Medical and social history Use of alcohol, tobacco or illicit drugs  Current medications and supplements including opioid prescriptions. Patient is not currently taking opioid prescriptions. Functional ability and status Nutritional status Physical activity Advanced directives List of other physicians Hospitalizations, surgeries, and ER visits in previous 12 months Vitals Screenings to include cognitive, depression, and falls Referrals and appointments  In addition, I have reviewed and discussed with patient certain preventive protocols, quality metrics, and best practice recommendations. A written personalized care plan for preventive services as well as general preventive health recommendations were provided to patient.     Kellie Simmering, LPN   [id number]   Nurse Notes: none  Due to this being a virtual visit, the after visit summary with patients personalized plan was offered to patient via mail or my-chart.  to pick up at office at next visit

## 2022-06-24 ENCOUNTER — Ambulatory Visit: Payer: Medicare Other | Admitting: Registered Nurse

## 2022-06-24 ENCOUNTER — Encounter: Payer: Self-pay | Admitting: Family Medicine

## 2022-06-24 ENCOUNTER — Ambulatory Visit (INDEPENDENT_AMBULATORY_CARE_PROVIDER_SITE_OTHER): Payer: Medicare Other | Admitting: Family Medicine

## 2022-06-24 ENCOUNTER — Encounter: Payer: Self-pay | Admitting: Registered Nurse

## 2022-06-24 VITALS — BP 138/76 | HR 76 | Resp 18

## 2022-06-24 VITALS — BP 132/70 | HR 62 | Temp 98.1°F | Ht 65.0 in | Wt 157.6 lb

## 2022-06-24 DIAGNOSIS — L255 Unspecified contact dermatitis due to plants, except food: Secondary | ICD-10-CM | POA: Insufficient documentation

## 2022-06-24 MED ORDER — AQUAPHOR EX OINT: TOPICAL_OINTMENT | CUTANEOUS | 0 refills | Status: AC | PRN

## 2022-06-24 MED ORDER — DIPHENHYDRAMINE HCL 2 % EX GEL: 1.0000 | Freq: Two times a day (BID) | CUTANEOUS | Status: AC | PRN

## 2022-06-24 MED ORDER — TRIAMCINOLONE ACETONIDE 0.1 % EX CREA: TOPICAL_CREAM | CUTANEOUS | 0 refills | Status: DC

## 2022-06-24 MED ORDER — HYDROCORTISONE 1 % EX LOTN: 1.0000 | TOPICAL_LOTION | Freq: Two times a day (BID) | CUTANEOUS | 0 refills | Status: AC

## 2022-06-24 MED ORDER — PREDNISONE 10 MG PO TABS: ORAL_TABLET | ORAL | 0 refills | Status: AC

## 2022-06-24 NOTE — Progress Notes (Signed)
Subjective:    Patient ID: Alyssa Mccormick, female    DOB: 1950/09/11, 72 y.o.   MRN: [id number]  72y/o caucasian female established patient here for spreading rash after working in garden this weekend.  Started on arms spread to chest/breasts/back and thighs.  Denied fever/chills/n/v/d/dyspnea/dysphagia/dysphasia/wheezing/cough/purulent discharge.  It is itchy.  Has tried hydrocortisone OTC without relief.  [name] here with patient per her request as English second language.  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     Review of Systems  Constitutional:  Negative for chills, diaphoresis, fatigue and fever.  HENT:  Negative for trouble swallowing and voice change.   Eyes:  Negative for photophobia and visual disturbance.  Respiratory:  Negative for cough, shortness of breath, wheezing and stridor.   Gastrointestinal:  Negative for diarrhea, nausea and vomiting.  Genitourinary:  Negative for difficulty urinating.  Skin:  Positive for color change and rash. Negative for pallor and wound.  Neurological:  Negative for dizziness, tremors, syncope, speech difficulty, weakness and light-headedness.  Hematological:  Negative for adenopathy. Does not bruise/bleed easily.  Psychiatric/Behavioral:  Negative for agitation, confusion and sleep disturbance.        Objective:   Physical Exam Vitals and nursing note reviewed.  Constitutional:      General: She is awake. She is not in acute distress.    Appearance: Normal appearance. She is well-developed, well-groomed and normal weight. She is not ill-appearing, toxic-appearing or diaphoretic.  HENT:     Head: Normocephalic and atraumatic.     Jaw: There is normal jaw occlusion.     Salivary Glands: Right salivary gland is not diffusely enlarged or tender. Left salivary gland is not diffusely enlarged or tender.     Right Ear: Hearing and external ear normal.     Left Ear: Hearing and external ear normal.     Nose: Nose normal. No congestion or  rhinorrhea.     Mouth/Throat:     Lips: Pink. No lesions.     Mouth: Mucous membranes are moist. No oral lesions or angioedema.     Dentition: No gum lesions.     Tongue: No lesions. Tongue does not deviate from midline.     Palate: No mass and lesions.     Pharynx: Oropharynx is clear. Uvula midline. No pharyngeal swelling, oropharyngeal exudate, posterior oropharyngeal erythema or uvula swelling.  Eyes:     General: Lids are normal. Vision grossly intact. Gaze aligned appropriately. Allergic shiner present. No scleral icterus.       Right eye: No discharge.        Left eye: No discharge.     Extraocular Movements: Extraocular movements intact.     Conjunctiva/sclera: Conjunctivae normal.     Pupils: Pupils are equal, round, and reactive to light.  Neck:     Trachea: Trachea and phonation normal. No abnormal tracheal secretions or tracheal deviation.  Cardiovascular:     Rate and Rhythm: Normal rate and regular rhythm.     Pulses: Normal pulses.          Radial pulses are 2+ on the right side and 2+ on the left side.  Pulmonary:     Effort: Pulmonary effort is normal.     Breath sounds: Normal breath sounds and air entry. No stridor or transmitted upper airway sounds. No decreased breath sounds, wheezing or rhonchi.     Comments: Spoke full sentences without difficulty; no cough observed in exam room Chest:    Abdominal:     Palpations:  Abdomen is soft.  Musculoskeletal:        General: Swelling and tenderness present. No deformity. Normal range of motion.     Right elbow: No swelling, deformity, effusion or lacerations. Normal range of motion.     Left elbow: No swelling, deformity, effusion or lacerations. Normal range of motion.     Right forearm: No swelling, edema, lacerations or tenderness.     Left forearm: No swelling, edema, lacerations or tenderness.     Right wrist: No swelling. Normal range of motion.     Left wrist: No swelling. Normal range of motion.     Right  hand: No swelling or tenderness. Normal strength. Normal capillary refill.     Left hand: No swelling or tenderness. Normal strength. Normal capillary refill.       Arms:     Cervical back: Normal range of motion and neck supple. No edema, erythema, signs of trauma, rigidity, torticollis, tenderness or crepitus. No pain with movement. Normal range of motion.       Legs:  Lymphadenopathy:     Head:     Right side of head: No submandibular or preauricular adenopathy.     Left side of head: No submandibular or preauricular adenopathy.     Cervical: No cervical adenopathy.     Right cervical: No superficial cervical adenopathy.    Left cervical: No superficial cervical adenopathy.  Skin:    General: Skin is warm and dry.     Capillary Refill: Capillary refill takes less than 2 seconds.     Coloration: Skin is not ashen, cyanotic, jaundiced, mottled, pale or sallow.     Findings: Abrasion, erythema and rash present. No abscess, acne, bruising, burn, ecchymosis, signs of injury, laceration, lesion, petechiae or wound. Rash is macular and papular. Rash is not crusting, nodular, purpuric, pustular, scaling, urticarial or vesicular.     Nails: There is no clubbing.          Comments: Arms, legs, chest, breasts, thigh and back macular erythema with linear abrasions largest back and chest at bra line dry pink  Neurological:     General: No focal deficit present.     Mental Status: She is alert and oriented to person, place, and time. Mental status is at baseline.     GCS: GCS eye subscore is 4. GCS verbal subscore is 5. GCS motor subscore is 6.     Cranial Nerves: Cranial nerves 2-12 are intact. No cranial nerve deficit, dysarthria or facial asymmetry.     Sensory: Sensation is intact.     Motor: Motor function is intact. No weakness, tremor, atrophy, abnormal muscle tone or seizure activity.     Coordination: Coordination is intact. Coordination normal.     Gait: Gait is intact. Gait normal.      Comments: In/out of chair and on/off exam table without difficulty; gait sure and steady in clinic; bilateral hand grasp equal 5/5  Psychiatric:        Attention and Perception: Attention and perception normal.        Mood and Affect: Mood and affect normal.        Speech: Speech normal.        Behavior: Behavior normal. Behavior is cooperative.        Thought Content: Thought content normal.        Cognition and Memory: Cognition and memory normal.        Judgment: Judgment normal.  Assessment & Plan:   A-contact dermatitis due to plants not including food  P-failed hydrocortisone topical.  More than 10% body area affected electronic Rx prednisone 10mg  sig t3 po x3 days, 2 tabs x 3 days then 1 tab x 5 days then 1/2 tab x 6 days #23 RF0 take with breakfast.   May  trial OTC zyrtec 10mg  po BID prn itching.  calagel thin smear BID prn itching given 4 UD from clinic stock; do not get in eyes; if worsening with calagel use stop.  Wash hands before and after application.  Avoid hot steam showers.  Apply emollient twice a day e.g. Fragrance free vaseline and 4x3 inch telfa gauze given 4 UD from clinic stock.  May apply ice/cold compress 5 minutes QID prn itching/swelling.  If scratching consider covering with bandaid to prevent clothes rubbing/scratching with fingers. Shower when she finishes work/prior to bedtime. Avoid steam/hot showers releases more antihistamines/worsens itching.   Avoid harsh/abrasive soaps use fragrance free/sensitive like dove/cetaphil.    Medication as directed. Call or return to clinic as needed if these symptoms worsen or fail to improve as anticipated. Exitcare handout on poison ivy and pruritis  Follow up for re-evaluation in 48 hours if no improvement and/or worsening of rash with plan of care. Patient verbalized agreement and understanding of treatment plan and had no further questions at this time

## 2022-06-24 NOTE — Progress Notes (Signed)
Established Patient Office Visit   Subjective:  Patient ID: Alyssa Mccormick, female    DOB: [birth year]  Age: 72 y.o. MRN: [id number]  Chief Complaint  Patient presents with   Rash    Rash on arms, chest and back of legs x 5 days spreading more little burning and itching.     Rash   Encounter Diagnoses  Name Primary?   Rhus dermatitis Yes   Presents with a 4 to 5-day history of a developing pruritic rash that was first noticed on her arms then moving to her chest and upper thighs.  Interdigital spaces and private areas are not affected.  She had been gathering ivy for an orthodox Easter service at her church.  Her daughter is here translating for the encounter.   Review of Systems  Constitutional: Negative.   HENT: Negative.    Eyes:  Negative for blurred vision, discharge and redness.  Respiratory: Negative.    Cardiovascular: Negative.   Gastrointestinal:  Negative for abdominal pain.  Genitourinary: Negative.   Musculoskeletal: Negative.  Negative for myalgias.  Skin:  Positive for itching and rash.  Neurological:  Negative for tingling, loss of consciousness and weakness.  Endo/Heme/Allergies:  Negative for polydipsia.     Current Outpatient Medications:    Calcium Carb-Cholecalciferol (CALCIUM 600-D PO), Take 1 tablet by mouth in the morning and at bedtime., Disp: , Rfl:    Cholecalciferol (VITAMIN D3 PO), Take 1 tablet by mouth daily., Disp: , Rfl:    diclofenac Sodium (VOLTAREN) 1 % GEL, Apply topically 4 (four) times daily., Disp: , Rfl:    DIPHENHYDRAMINE HCL, TOPICAL, 2 % GEL, Apply 1 Application topically 2 (two) times daily as needed for up to 7 days., Disp: , Rfl:    EPINEPHrine 0.3 mg/0.3 mL IJ SOAJ injection, Inject 0.3 mg into the muscle as needed for anaphylaxis (For accidental exposure to fish only). For allergic reaction to accidental exposure to fish., Disp: 1 each, Rfl: 0   loratadine (CLARITIN) 10 MG tablet, Take 1 tablet (10 mg total) by mouth  daily., Disp: 30 tablet, Rfl: 0   mineral oil-hydrophilic petrolatum (AQUAPHOR) ointment, Apply topically as needed for dry skin or irritation., Disp: 420 g, Rfl: 0   Multiple Vitamin (MULTIVITAMIN WITH MINERALS) TABS tablet, Take 1 tablet by mouth daily., Disp: , Rfl:    triamcinolone cream (KENALOG) 0.1 %, Apply a thin coat twice daily to rash as needed., Disp: 80 g, Rfl: 0   hydrocortisone 1 % lotion, Apply 1 Application topically 2 (two) times daily for 7 days. (Patient not taking: Reported on 06/24/2022), Disp: 8 mL, Rfl: 0   meloxicam (MOBIC) 7.5 MG tablet, Take 1 tablet (7.5 mg total) by mouth daily. As needed. (Patient not taking: Reported on 06/24/2022), Disp: 30 tablet, Rfl: 0   predniSONE (DELTASONE) 10 MG tablet, Take 3 tablets (30 mg total) by mouth daily with breakfast for 3 days, THEN 2 tablets (20 mg total) daily with breakfast for 3 days, THEN 1 tablet (10 mg total) daily with breakfast for 5 days, THEN 0.5 tablets (5 mg total) daily with breakfast for 6 days. (Patient not taking: Reported on 06/24/2022), Disp: 23 tablet, Rfl: 0   Objective:     BP 132/70 (BP Location: Left Arm, Patient Position: Sitting, Cuff Size: Normal)   Pulse 62   Temp 98.1 F (36.7 C) (Temporal)   Ht 5\' 5"  (1.651 m)   Wt 157 lb 9.6 oz (71.5 kg)   SpO2 97%  BMI 26.23 kg/m    Physical Exam Constitutional:      General: She is not in acute distress.    Appearance: Normal appearance. She is not ill-appearing, toxic-appearing or diaphoretic.  HENT:     Head: Normocephalic and atraumatic.     Right Ear: External ear normal.     Left Ear: External ear normal.  Eyes:     General: No scleral icterus.       Right eye: No discharge.        Left eye: No discharge.     Extraocular Movements: Extraocular movements intact.     Conjunctiva/sclera: Conjunctivae normal.  Pulmonary:     Effort: Pulmonary effort is normal. No respiratory distress.  Skin:    General: Skin is warm and dry.     Comments: 1.  On  the volar forearms are scattered erythematous patches measuring from 1/2-2 2 cm.  2.  There are scattered erythematous papules and patches on the anterior chest.  3.  On the lower back are erythematous papules coalescing in a linear distribution  Neurological:     Mental Status: She is alert and oriented to person, place, and time.  Psychiatric:        Mood and Affect: Mood normal.        Behavior: Behavior normal.      No results found for any visits on 06/24/22.    The 10-year ASCVD risk score (Arnett DK, et al., 2019) is: 14%    Assessment & Plan:   Rhus dermatitis -     Triamcinolone Acetonide; Apply a thin coat twice daily to rash as needed.  Dispense: 80 g; Refill: 0    Return Start prednisone Dosepak tomorrow morning..  Advised that she may experience new rash for the next week or 2.  Use the triamcinolone cream as needed.  Information on rhus dermatitis was given.  Mliss Sax, MD

## 2022-06-24 NOTE — Patient Instructions (Signed)
Pruritus Pruritus is an itchy feeling on the skin. One of the most common causes is dry skin, but many different things can cause itching. Most cases of itching do not require medical attention. Sometimes itchy skin can turn into a rash or a secondary infection. Follow these instructions at home: Skin care  Do not use scented soaps, detergents, perfumes, and cosmetic products. Instead, use gentle, unscented versions of these items. Apply moisturizing creams to your skin frequently, at least twice daily. Apply immediately after bathing while skin is still wet. Take medicines or apply medicated creams only as told by your health care provider. This may include: Corticosteroid cream or topical calcineurin inhibitor. Anti-itch lotions containing urea, camphor, or menthol. Oral antihistamines. Do not take hot showers or baths, which can make itching worse. A short, cool shower may help with itching as long as you apply moisturizing lotion after the shower. Apply a cool, wet cloth (cool compress) to the affected areas. You may take lukewarm baths with one of the following: Epsom salts. You can get these at your local pharmacy or grocery store. Follow the instructions on the packaging. Baking soda. Pour a small amount into the bath as told by your health care provider. Colloidal oatmeal. You can get this at your local pharmacy or grocery store. Follow the instructions on the packaging. Do not scratch your skin. General instructions Avoid wearing tight clothes. Keep a journal to help find out what is causing your itching. Write down: What you eat and drink. What cosmetic products you use. What soaps or detergents you use. What you wear, including jewelry. Use a humidifier. This keeps the air moist, which helps to prevent dry skin. Be aware of any changes in your itchiness. Tell your health care provider about any changes. Contact a health care provider if: The itching does not go away after  several days. You notice redness, warmth, or drainage on the skin where you have scratched. You are unusually thirsty or urinating more than normal. Your skin tingles or feels numb. Your skin or the white parts of your eyes turn yellow (jaundice). You feel weak. You have any of the following: Night sweats. Tiredness (fatigue). Weight loss. Abdominal pain. Summary Pruritus is an itchy feeling on the skin. One of the most common causes is dry skin, but many different conditions and factors can cause itching. Apply moisturizing creams to your skin frequently, at least twice daily. Apply immediately after bathing while skin is still wet. Take medicines or apply medicated creams only as told by your health care provider. Do not take hot showers or baths. Do not use scented soaps, detergents, perfumes, or cosmetic products. Keep a journal to help find out what is causing your itching. This information is not intended to replace advice given to you by your health care provider. Make sure you discuss any questions you have with your health care provider. Document Revised: 03/13/2021 Document Reviewed: 03/13/2021 Elsevier Patient Education  2023 [employer] Poison Ivy Dermatitis Poison ivy dermatitis is irritation and swelling (inflammation) of the skin caused by chemicals in the leaves of the poison ivy plant. The skin reaction often involves redness, blisters, and extreme itching. What are the causes? This condition is caused by a chemical (urushiol) found in the sap of the poison ivy plant. This chemical is sticky and can easily spread to people, animals, and objects. You can get poison ivy dermatitis by: Having direct contact with a poison ivy plant. Touching animals, other people, or objects  that have come in contact with poison ivy and have the chemical on them. What increases the risk? This condition is more likely to develop in people who: Are outdoors often in wooded or [place]  areas. Go outdoors without wearing protective clothing, such as closed shoes, long pants, and a long-sleeved shirt. What are the signs or symptoms? Symptoms of this condition include: Redness of the skin. Extreme itching. A rash that often includes bumps and blisters. The rash usually appears 48 hours after exposure, if you have been exposed before. If this is the first time you have been exposed, the rash may not appear until a week after exposure. Swelling. This may occur if the reaction is more severe. Symptoms usually last for 1-2 weeks. However, the first time you develop this condition, symptoms may last 3-4 weeks. How is this diagnosed? This condition may be diagnosed based on your symptoms and a physical exam. Your health care provider may also ask you about any recent outdoor activity. How is this treated? Treatment for this condition will vary depending on how severe it is. Treatment may include: Hydrocortisone cream or calamine lotion to relieve itching. Oatmeal baths to soothe the skin. Medicines, such as over-the-counter antihistamine tablets. Oral or injected steroid medicine, for more severe reactions. Follow these instructions at home: Medicines Take or apply over-the-counter and prescription medicines only as told by your health care provider. Use hydrocortisone cream or calamine lotion as needed to soothe the skin and relieve itching. General instructions Do not scratch or rub your skin. Apply a cold, wet cloth (cold compress) to the affected areas or take baths in cool water. This will help with itching. Avoid hot baths and showers. Take oatmeal baths as needed. Use colloidal oatmeal. You can get this at your local pharmacy or grocery store. Follow the instructions on the packaging. Wash all clothes, bedsheets, towels, and blankets you were in contact with between your exposure and appearance of the rash. Check the affected area every day for signs of infection. Check  for: More redness, swelling, or pain. Fluid or blood. Warmth. Pus or a bad smell. Keep all follow-up visits. Your health care provider may want to see how your skin is progressing with treatment. How is this prevented?  Learn to identify the poison ivy plant and avoid contact with the plant. This plant can be recognized by the number of leaves. Generally, poison ivy has three leaves with flowering branches on a single stem. The leaves are typically glossy, and they have jagged edges that come to a point. If you have been exposed to poison ivy, thoroughly wash with soap and water right away. You have about 30 minutes to remove the plant resin before it will cause the rash. Be sure to wash under your fingernails, because any plant resin there will continue to spread the rash. When hiking or camping, wear clothes that will help you to avoid skin exposure. This includes long pants, a long-sleeved shirt, long socks, and hiking boots. You can also apply preventive lotion to your skin to help limit exposure. If you suspect that your clothes or outdoor gear came in contact with poison ivy, rinse them off outside with a garden hose before you bring them inside your house. When doing yard work or gardening, wear gloves, long sleeves, long pants, and boots. Wash your garden tools and gloves if they come in contact with poison ivy. If you suspect that your pet has come into contact with poison ivy, wash them  with pet shampoo and water. Make sure to wear gloves while washing your pet. Contact a health care provider if: You have open sores in the rash area. You have any signs of infection. You have redness that spreads beyond the rash area. You have a fever. You have a rash over a large area of your body. You have a rash on your eyes, mouth, or genitals. You have a rash that does not improve after a few weeks. Get help right away if: Your face swells or your eyes swell shut. You have trouble  breathing. You have trouble swallowing. These symptoms may be an emergency. Get help right away. Call 911. Do not wait to see if the symptoms will go away. Do not drive yourself to the hospital. This information is not intended to replace advice given to you by your health care provider. Make sure you discuss any questions you have with your health care provider. Document Revised: 07/04/2021 Document Reviewed: 07/04/2021 Elsevier Patient Education  2023 [employer].

## 2022-06-30 ENCOUNTER — Other Ambulatory Visit: Payer: Self-pay | Admitting: Family Medicine

## 2022-06-30 DIAGNOSIS — L255 Unspecified contact dermatitis due to plants, except food: Secondary | ICD-10-CM

## 2022-07-03 ENCOUNTER — Other Ambulatory Visit: Payer: Self-pay | Admitting: Family Medicine

## 2022-07-03 DIAGNOSIS — L255 Unspecified contact dermatitis due to plants, except food: Secondary | ICD-10-CM

## 2022-07-17 DIAGNOSIS — K080 Exfoliation of teeth due to systemic causes: Secondary | ICD-10-CM | POA: Diagnosis not present

## 2022-08-03 NOTE — Progress Notes (Signed)
Noted patient has had follow up labs 

## 2022-08-05 DIAGNOSIS — Z6828 Body mass index (BMI) 28.0-28.9, adult: Secondary | ICD-10-CM | POA: Diagnosis not present

## 2022-08-05 DIAGNOSIS — Z01419 Encounter for gynecological examination (general) (routine) without abnormal findings: Secondary | ICD-10-CM | POA: Diagnosis not present

## 2022-08-05 DIAGNOSIS — Z1231 Encounter for screening mammogram for malignant neoplasm of breast: Secondary | ICD-10-CM | POA: Diagnosis not present

## 2022-08-11 ENCOUNTER — Other Ambulatory Visit: Payer: Self-pay | Admitting: Obstetrics and Gynecology

## 2022-08-11 DIAGNOSIS — R928 Other abnormal and inconclusive findings on diagnostic imaging of breast: Secondary | ICD-10-CM

## 2022-08-13 ENCOUNTER — Ambulatory Visit: Payer: Medicare Other

## 2022-08-13 ENCOUNTER — Ambulatory Visit
Admission: RE | Admit: 2022-08-13 | Discharge: 2022-08-13 | Disposition: A | Discharge status: Home / Self Care | Payer: Medicare Other | Source: Ambulatory Visit | Attending: Obstetrics and Gynecology | Admitting: Obstetrics and Gynecology

## 2022-08-13 DIAGNOSIS — R928 Other abnormal and inconclusive findings on diagnostic imaging of breast: Secondary | ICD-10-CM

## 2022-11-28 ENCOUNTER — Telehealth: Payer: Self-pay | Admitting: Family Medicine

## 2022-11-28 NOTE — Telephone Encounter (Signed)
I called the pt to remind them of the appt on Monday. Pt daughter want to know if the patient can come in the morning to do the lab work in the morning instead of the pt not eating all day. Please give the patient daughter a call

## 2022-11-28 NOTE — Telephone Encounter (Signed)
I let pt daughter know that another option is the patient do labs after her physical on another day . Pt said ok she has to ask her mom about her work schedule and will call back to schedule a lab appt.

## 2022-12-01 ENCOUNTER — Encounter: Payer: Medicare Other | Admitting: Family Medicine

## 2022-12-01 ENCOUNTER — Telehealth: Payer: Self-pay | Admitting: Family Medicine

## 2022-12-01 NOTE — Telephone Encounter (Signed)
pt daughter cancel/reschedule today @ 1:05, 1st missed visit

## 2022-12-01 NOTE — Telephone Encounter (Signed)
10.11.24 no show / no show letter

## 2023-03-03 ENCOUNTER — Encounter: Payer: Self-pay | Admitting: Family Medicine

## 2023-03-03 ENCOUNTER — Ambulatory Visit (INDEPENDENT_AMBULATORY_CARE_PROVIDER_SITE_OTHER): Payer: Medicare Other | Admitting: Family Medicine

## 2023-03-03 VITALS — BP 126/86 | HR 68 | Temp 97.6°F | Ht 65.0 in | Wt 156.0 lb

## 2023-03-03 DIAGNOSIS — Z131 Encounter for screening for diabetes mellitus: Secondary | ICD-10-CM | POA: Diagnosis not present

## 2023-03-03 DIAGNOSIS — Z0000 Encounter for general adult medical examination without abnormal findings: Secondary | ICD-10-CM

## 2023-03-03 DIAGNOSIS — M19041 Primary osteoarthritis, right hand: Secondary | ICD-10-CM

## 2023-03-03 DIAGNOSIS — E7800 Pure hypercholesterolemia, unspecified: Secondary | ICD-10-CM | POA: Diagnosis not present

## 2023-03-03 DIAGNOSIS — M19042 Primary osteoarthritis, left hand: Secondary | ICD-10-CM

## 2023-03-03 LAB — COMPREHENSIVE METABOLIC PANEL
ALT: 14 U/L (ref 0–35)
AST: 17 U/L (ref 0–37)
Albumin: 4.2 g/dL (ref 3.5–5.2)
Alkaline Phosphatase: 65 U/L (ref 39–117)
BUN: 21 mg/dL (ref 6–23)
CO2: 32 meq/L (ref 19–32)
Calcium: 9.6 mg/dL (ref 8.4–10.5)
Chloride: 99 meq/L (ref 96–112)
Creatinine, Ser: 0.64 mg/dL (ref 0.40–1.20)
GFR: 87.31 mL/min (ref >60.00)
Glucose, Bld: 90 mg/dL (ref 70–99)
Potassium: 4.2 meq/L (ref 3.5–5.1)
Sodium: 138 meq/L (ref 135–145)
Total Bilirubin: 0.4 mg/dL (ref 0.2–1.2)
Total Protein: 7 g/dL (ref 6.0–8.3)

## 2023-03-03 LAB — CBC WITH DIFFERENTIAL/PLATELET
Basophils Absolute: 0 10*3/uL (ref 0.0–0.1)
Basophils Relative: 0.4 % (ref 0.0–3.0)
Eosinophils Absolute: 0.2 10*3/uL (ref 0.0–0.7)
Eosinophils Relative: 3.2 % (ref 0.0–5.0)
HCT: 42.3 % (ref 36.0–46.0)
Hemoglobin: 14 g/dL (ref 12.0–15.0)
Lymphocytes Relative: 28.2 % (ref 12.0–46.0)
Lymphs Abs: 2 10*3/uL (ref 0.7–4.0)
MCHC: 33.2 g/dL (ref 30.0–36.0)
MCV: 95.6 fL (ref 78.0–100.0)
Monocytes Absolute: 0.5 10*3/uL (ref 0.1–1.0)
Monocytes Relative: 7 % (ref 3.0–12.0)
Neutro Abs: 4.3 10*3/uL (ref 1.4–7.7)
Neutrophils Relative %: 61.2 % (ref 43.0–77.0)
Platelets: 285 10*3/uL (ref 150.0–400.0)
RBC: 4.43 Mil/uL (ref 3.87–5.11)
RDW: 12.7 % (ref 11.5–15.5)
WBC: 7 10*3/uL (ref 4.0–10.5)

## 2023-03-03 LAB — URINALYSIS, ROUTINE W REFLEX MICROSCOPIC
Bilirubin Urine: NEGATIVE
Hgb urine dipstick: NEGATIVE
Ketones, ur: NEGATIVE
Nitrite: NEGATIVE
Specific Gravity, Urine: 1.015 (ref 1.000–1.030)
Total Protein, Urine: NEGATIVE
Urine Glucose: NEGATIVE
Urobilinogen, UA: 0.2 (ref 0.0–1.0)
pH: 8 (ref 5.0–8.0)

## 2023-03-03 LAB — HEMOGLOBIN A1C: Hgb A1c MFr Bld: 5.9 % (ref 4.6–6.5)

## 2023-03-03 LAB — LIPID PANEL
Cholesterol: 262 mg/dL — ABNORMAL HIGH (ref 0–200)
HDL: 65.5 mg/dL (ref >39.00)
LDL Cholesterol: 166 mg/dL — ABNORMAL HIGH (ref 0–99)
NonHDL: 196.76
Total CHOL/HDL Ratio: 4
Triglycerides: 155 mg/dL — ABNORMAL HIGH (ref 0.0–149.0)
VLDL: 31 mg/dL (ref 0.0–40.0)

## 2023-03-03 NOTE — Progress Notes (Addendum)
 Established Patient Office Visit   Subjective:  Patient ID: Alyssa Mccormick, female    DOB: [birth year]  Age: 73 y.o. MRN: [id number]  Chief Complaint  Patient presents with   Annual Exam    CPE. Pt is fasting.     HPI Encounter Diagnoses  Name Primary?   Healthcare maintenance Yes   Screening for diabetes mellitus    Elevated LDL cholesterol level    Arthritis of both hands    For physical and follow-up of above.  Her daughter is translating.  She is doing well.  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  She is exercising by walking for 30 minutes daily.  She consumes a low-fat low-cholesterol diet but reminds me that there was celebration with the holidays and food choices made were not as good as they are for the rest of the year.   Review of Systems  Constitutional: Negative.   HENT: Negative.    Eyes:  Negative for blurred vision, discharge and redness.  Respiratory: Negative.    Cardiovascular: Negative.   Gastrointestinal:  Negative for abdominal pain.  Genitourinary: Negative.   Musculoskeletal:  Positive for joint pain. Negative for myalgias.  Skin:  Negative for rash.  Neurological:  Negative for tingling, loss of consciousness and weakness.  Endo/Heme/Allergies:  Negative for polydipsia.      06/24/2022    4:16 PM 04/07/2022    4:17 PM 11/29/2021    9:01 AM  Depression screen PHQ 2/9  Decreased Interest 0 0 0  Down, Depressed, Hopeless 0 0 1  PHQ - 2 Score 0 0 1  Altered sleeping   1  Tired, decreased energy   0  Change in appetite   0  Feeling bad or failure about yourself    0  Trouble concentrating   0  Moving slowly or fidgety/restless   0  Suicidal thoughts   0  PHQ-9 Score   2  Difficult doing work/chores   Not difficult at all       Current Outpatient Medications:    Calcium Carb-Cholecalciferol (CALCIUM 600-D PO), Take 1 tablet by mouth in the morning and at bedtime., Disp: , Rfl:    Cholecalciferol (VITAMIN D3 PO), Take 1  tablet by mouth daily., Disp: , Rfl:    diclofenac Sodium (VOLTAREN) 1 % GEL, Apply topically 4 (four) times daily., Disp: , Rfl:    EPINEPHrine  0.3 mg/0.3 mL IJ SOAJ injection, Inject 0.3 mg into the muscle as needed for anaphylaxis (For accidental exposure to fish only). For allergic reaction to accidental exposure to fish., Disp: 1 each, Rfl: 0   loratadine  (CLARITIN ) 10 MG tablet, Take 1 tablet (10 mg total) by mouth daily., Disp: 30 tablet, Rfl: 0   mineral oil-hydrophilic petrolatum (AQUAPHOR) ointment, Apply topically as needed for dry skin or irritation., Disp: 420 g, Rfl: 0   Multiple Vitamin (MULTIVITAMIN WITH MINERALS) TABS tablet, Take 1 tablet by mouth daily., Disp: , Rfl:    triamcinolone  cream (KENALOG ) 0.1 %, APPLY THIN LAYER TOPICALLY TO RASH TWICE DAILY AS NEEDED, Disp: 80 g, Rfl: 0   meloxicam  (MOBIC ) 7.5 MG tablet, Take 1 tablet (7.5 mg total) by mouth daily. As needed. (Patient not taking: Reported on 03/03/2023), Disp: 30 tablet, Rfl: 0   Objective:     BP 126/86   Pulse 68   Temp 97.6 F (36.4 C)   Ht 5' 5 (1.651 m)   Wt 156 lb (70.8 kg)   SpO2 98%  BMI 25.96 kg/m    Physical Exam Constitutional:      General: She is not in acute distress.    Appearance: Normal appearance. She is not ill-appearing, toxic-appearing or diaphoretic.  HENT:     Head: Normocephalic and atraumatic.     Right Ear: Tympanic membrane, ear canal and external ear normal.     Left Ear: Tympanic membrane, ear canal and external ear normal.     Mouth/Throat:     Mouth: Mucous membranes are moist.     Pharynx: Oropharynx is clear. No oropharyngeal exudate or posterior oropharyngeal erythema.  Eyes:     General: No scleral icterus.       Right eye: No discharge.        Left eye: No discharge.     Extraocular Movements: Extraocular movements intact.     Conjunctiva/sclera: Conjunctivae normal.     Pupils: Pupils are equal, round, and reactive to light.  Cardiovascular:     Rate and  Rhythm: Normal rate and regular rhythm.  Pulmonary:     Effort: Pulmonary effort is normal. No respiratory distress.     Breath sounds: Normal breath sounds.  Abdominal:     General: Bowel sounds are normal.  Musculoskeletal:     Cervical back: No rigidity or tenderness.     Right lower leg: No edema.     Left lower leg: No edema.  Skin:    General: Skin is warm and dry.  Neurological:     Mental Status: She is alert and oriented to person, place, and time.  Psychiatric:        Mood and Affect: Mood normal.        Behavior: Behavior normal.      No results found for any visits on 03/03/23.    The 10-year ASCVD risk score (Arnett DK, et al., 2019) is: 14.3%    Assessment & Plan:   Healthcare maintenance -     CBC with Differential/Platelet -     Urinalysis, Routine w reflex microscopic  Screening for diabetes mellitus -     Comprehensive metabolic panel -     Hemoglobin A1c  Elevated LDL cholesterol level -     Lipid panel  Arthritis of both hands    Return in about 1 year (around 03/02/2024), or if symptoms worsen or fail to improve.  Information was given on health maintenance and disease prevention.  Also given information on the Mediterranean diet and encouraged to follow that or a low-fat low-cholesterol diet.  She continues to use Voltaren gel for the arthritis in her hands.  Elsie Sim Lent, MD

## 2023-04-11 DIAGNOSIS — H524 Presbyopia: Secondary | ICD-10-CM | POA: Diagnosis not present

## 2023-04-20 DIAGNOSIS — H25813 Combined forms of age-related cataract, bilateral: Secondary | ICD-10-CM | POA: Diagnosis not present

## 2023-05-07 DIAGNOSIS — H2513 Age-related nuclear cataract, bilateral: Secondary | ICD-10-CM | POA: Diagnosis not present

## 2023-05-07 DIAGNOSIS — H25811 Combined forms of age-related cataract, right eye: Secondary | ICD-10-CM | POA: Diagnosis not present

## 2023-05-07 HISTORY — PX: CATARACT EXTRACTION: SUR2

## 2023-05-15 ENCOUNTER — Ambulatory Visit: Payer: Medicare Other

## 2023-05-15 DIAGNOSIS — Z0000 Encounter for general adult medical examination without abnormal findings: Secondary | ICD-10-CM

## 2023-05-15 NOTE — Progress Notes (Signed)
 Subjective:   Alyssa Mccormick is a 73 y.o. who presents for a Medicare Wellness preventive visit.  Visit Complete: Virtual I connected with  Alyssa Mccormick on 05/15/23 by a audio enabled telemedicine application and verified that I am speaking with the correct person using two identifiers.  Patient Location: Home  Provider Location: Office/Clinic  I discussed the limitations of evaluation and management by telemedicine. The patient expressed understanding and agreed to proceed.  Vital Signs: Because this visit was a virtual/telehealth visit, some criteria may be missing or patient reported. Any vitals not documented were not able to be obtained and vitals that have been documented are patient reported.  VideoError- Librarian, academic were attempted between this provider and patient, however failed, due to patient having technical difficulties OR patient did not have access to video capability.  We continued and completed visit with audio only.   Persons Participating in Visit: Patient assisted by daughter.  AWV Questionnaire: No: Patient Medicare AWV questionnaire was not completed prior to this visit.  Cardiac Risk Factors include: advanced age (>67men, >12 women);dyslipidemia     Objective:    Today's Vitals   There is no height or weight on file to calculate BMI.     05/15/2023    3:49 PM 04/07/2022    4:17 PM 05/20/2019    6:48 PM 10/12/2015    4:40 PM  Advanced Directives  Does Patient Have a Medical Advance Directive? No No No No  Would patient like information on creating a medical advance directive? No - Patient declined  No - Patient declined     Current Medications (verified) Outpatient Encounter Medications as of 05/15/2023  Medication Sig   Calcium Carb-Cholecalciferol (CALCIUM 600-D PO) Take 1 tablet by mouth in the morning and at bedtime.   Cholecalciferol (VITAMIN D3 PO) Take 1 tablet by mouth daily.   diclofenac Sodium  (VOLTAREN) 1 % GEL Apply topically 4 (four) times daily.   EPINEPHrine 0.3 mg/0.3 mL IJ SOAJ injection Inject 0.3 mg into the muscle as needed for anaphylaxis (For accidental exposure to fish only). For allergic reaction to accidental exposure to fish.   loratadine (CLARITIN) 10 MG tablet Take 1 tablet (10 mg total) by mouth daily.   Multiple Vitamin (MULTIVITAMIN WITH MINERALS) TABS tablet Take 1 tablet by mouth daily.   triamcinolone cream (KENALOG) 0.1 % APPLY THIN LAYER TOPICALLY TO RASH TWICE DAILY AS NEEDED   meloxicam (MOBIC) 7.5 MG tablet Take 1 tablet (7.5 mg total) by mouth daily. As needed. (Patient not taking: Reported on 06/24/2022)   mineral oil-hydrophilic petrolatum (AQUAPHOR) ointment Apply topically as needed for dry skin or irritation. (Patient not taking: Reported on 05/15/2023)   No facility-administered encounter medications on file as of 05/15/2023.    Allergies (verified) Fish allergy   History: Past Medical History:  Diagnosis Date   Arthritis    hands   Diverticulosis    Gall stones    Past Surgical History:  Procedure Laterality Date   ABDOMINAL HYSTERECTOMY     CATARACT EXTRACTION Right 05/07/2023   Family History  Problem Relation Age of Onset   Diabetes Mother    Esophageal cancer Father    Colon cancer Neg Hx    Colon polyps Neg Hx    Rectal cancer Neg Hx    Stomach cancer Neg Hx    Social History   Socioeconomic History   Marital status: Widowed    Spouse name: Not on file   Number of  children: Not on file   Years of education: Not on file   Highest education level: Not on file  Occupational History   Not on file  Tobacco Use   Smoking status: Never   Smokeless tobacco: Never  Vaping Use   Vaping status: Never Used  Substance and Sexual Activity   Alcohol use: No    Alcohol/week: 0.0 standard drinks of alcohol   Drug use: No   Sexual activity: Not Currently  Other Topics Concern   Not on file  Social History Narrative   Not on  file   Social Drivers of Health   Financial Resource Strain: Low Risk  (05/15/2023)   Overall Financial Resource Strain (CARDIA)    Difficulty of Paying Living Expenses: Not hard at all  Food Insecurity: No Food Insecurity (05/15/2023)   Hunger Vital Sign    Worried About Running Out of Food in the Last Year: Never true    Ran Out of Food in the Last Year: Never true  Transportation Needs: No Transportation Needs (05/15/2023)   PRAPARE - Administrator, Civil Service (Medical): No    Lack of Transportation (Non-Medical): No  Physical Activity: Sufficiently Active (05/15/2023)   Exercise Vital Sign    Days of Exercise per Week: 4 days    Minutes of Exercise per Session: 40 min  Stress: No Stress Concern Present (05/15/2023)   [employer] of Occupational Health - Occupational Stress Questionnaire    Feeling of Stress : Not at all  Social Connections: Moderately Isolated (05/15/2023)   Social Connection and Isolation Panel [NHANES]    Frequency of Communication with Friends and Family: More than three times a week    Frequency of Social Gatherings with Friends and Family: More than three times a week    Attends Religious Services: More than 4 times per year    Active Member of [employer] or Organizations: No    Attends Banker Meetings: Never    Marital Status: Widowed    Tobacco Counseling Counseling given: Not Answered    Clinical Intake:  Pre-visit preparation completed: Yes  Pain : No/denies pain     Nutritional Risks: None Diabetes: No  Lab Results  Component Value Date   HGBA1C 5.9 03/03/2023   HGBA1C 5.7 10/11/2018   HGBA1C 5.4 02/26/2018     How often do you need to have someone help you when you read instructions, pamphlets, or other written materials from your doctor or pharmacy?: 4 - Often  Interpreter Needed?: No  Comments: daughter interprets Information entered by :: NAllen LPN   Activities of Daily Living     05/15/2023     3:43 PM  In your present state of health, do you have any difficulty performing the following activities:  Hearing? 0  Vision? 0  Difficulty concentrating or making decisions? 0  Walking or climbing stairs? 0  Dressing or bathing? 0  Doing errands, shopping? 1  Comment daughter goes with her to appointments  Preparing Food and eating ? N  Using the Toilet? N  In the past six months, have you accidently leaked urine? N  Do you have problems with loss of bowel control? N  Managing your Medications? N  Managing your Finances? N  Housekeeping or managing your Housekeeping? N    Patient Care Team: Mliss Sax, MD as PCP - General (Family Medicine)  Indicate any recent Medical Services you may have received from other than [facility] providers in the past  year (date may be approximate).     Assessment:   This is a routine wellness examination for Alyssa Mccormick.  Hearing/Vision screen Hearing Screening - Comments:: Denies hearing issues Vision Screening - Comments:: Regular eye exams, [facility]   Goals Addressed             This Visit's Progress    Patient Stated       05/15/2023, maintain current lifestyle       Depression Screen     05/15/2023    3:50 PM 06/24/2022    4:16 PM 04/07/2022    4:17 PM 11/29/2021    9:01 AM 11/29/2021    8:26 AM 11/14/2020   10:43 AM 11/14/2020   10:14 AM  PHQ 2/9 Scores  PHQ - 2 Score 0 0 0 1 0 0 0  PHQ- 9 Score 0   2  0     Fall Risk     05/15/2023    3:50 PM 06/24/2022    4:16 PM 04/07/2022    4:17 PM 11/29/2021    8:26 AM 11/14/2020   10:14 AM  Fall Risk   Falls in the past year? 0 0 0 0 1  Number falls in past yr: 0 0 0 0 0  Injury with Fall? 0 0 0  0  Risk for fall due to : Medication side effect No Fall Risks Medication side effect    Follow up Falls prevention discussed;Falls evaluation completed Falls evaluation completed Falls prevention discussed;Education provided;Falls evaluation completed      MEDICARE RISK AT HOME:   Medicare Risk at Home Any stairs in or around the home?: Yes If so, are there any without handrails?: No Home free of loose throw rugs in walkways, pet beds, electrical cords, etc?: Yes Adequate lighting in your home to reduce risk of falls?: Yes Life alert?: No Use of a cane, walker or w/c?: No Grab bars in the bathroom?: No Shower chair or bench in shower?: No Elevated toilet seat or a handicapped toilet?: No  TIMED UP AND GO:  Was the test performed?  No  Cognitive Function: not administered due to language barrier. Daughter interpreted and she appears cognitive per conversation        Immunizations Immunization History  Administered Date(s) Administered   DT (Pediatric) 11/22/2008   PFIZER Comirnaty(Gray Top)Covid-19 Tri-Sucrose Vaccine 02/06/2020, 07/29/2020   Tdap 11/22/2008, 05/20/2012    Screening Tests Health Maintenance  Topic Date Due   Zoster Vaccines- Shingrix (1 of 2) 09/24/2023 (Originally [birth year]/1999)   MAMMOGRAM  07/09/2023   Medicare Annual Wellness (AWV)  05/14/2024   Colonoscopy  10/18/2025   DEXA SCAN  Completed   HPV VACCINES  Aged Out   DTaP/Tdap/Td  Discontinued   Pneumonia Vaccine 80+ Years old  Discontinued   INFLUENZA VACCINE  Discontinued   COVID-19 Vaccine  Discontinued   Hepatitis C Screening  Discontinued    Health Maintenance  There are no preventive care reminders to display for this patient.  Health Maintenance Items Addressed: Declines vaccines. Mammogram report requested  Additional Screening:  Vision Screening: Recommended annual ophthalmology exams for early detection of glaucoma and other disorders of the eye.  Dental Screening: Recommended annual dental exams for proper oral hygiene  Community Resource Referral / Chronic Care Management: CRR required this visit?  No   CCM required this visit?  No     Plan:     I have personally reviewed and noted the following in the patient's chart:  Medical and social  history Use of alcohol, tobacco or illicit drugs  Current medications and supplements including opioid prescriptions. Patient is not currently taking opioid prescriptions. Functional ability and status Nutritional status Physical activity Advanced directives List of other physicians Hospitalizations, surgeries, and ER visits in previous 12 months Vitals Screenings to include cognitive, depression, and falls Referrals and appointments  In addition, I have reviewed and discussed with patient certain preventive protocols, quality metrics, and best practice recommendations. A written personalized care plan for preventive services as well as general preventive health recommendations were provided to patient.     Barb Merino, LPN   1/61/0960   After Visit Summary: (MyChart) Due to this being a telephonic visit, the after visit summary with patients personalized plan was offered to patient via MyChart   Notes: Nothing significant to report at this time.

## 2023-05-15 NOTE — Patient Instructions (Signed)
 Alyssa Mccormick , Thank you for taking time to come for your Medicare Wellness Visit. I appreciate your ongoing commitment to your health goals. Please review the following plan we discussed and let me know if I can assist you in the future.   Referrals/Orders/Follow-Ups/Clinician Recommendations: none  This is a list of the screening recommended for you and due dates:  Health Maintenance  Topic Date Due   Zoster (Shingles) Vaccine (1 of 2) 09/24/2023*   Mammogram  07/09/2023   Medicare Annual Wellness Visit  05/14/2024   Colon Cancer Screening  10/18/2025   DEXA scan (bone density measurement)  Completed   HPV Vaccine  Aged Out   DTaP/Tdap/Td vaccine  Discontinued   Pneumonia Vaccine  Discontinued   Flu Shot  Discontinued   COVID-19 Vaccine  Discontinued   Hepatitis C Screening  Discontinued  *Topic was postponed. The date shown is not the original due date.    Advanced directives: (ACP Link)Information on Advanced Care Planning can be found at [facility] Advance Health Care Directives Advance Health Care Directives. [email or web address]   Next Medicare Annual Wellness Visit scheduled for next year: Yes  insert Preventive Care attachment Insert FALL PREVENTION attachment if needed

## 2023-05-25 ENCOUNTER — Other Ambulatory Visit: Payer: Self-pay | Admitting: Family Medicine

## 2023-05-25 DIAGNOSIS — Z91013 Allergy to seafood: Secondary | ICD-10-CM

## 2023-05-25 MED ORDER — EPINEPHRINE 0.3 MG/0.3ML IJ SOAJ
0.3000 mg | INTRAMUSCULAR | 0 refills | Status: AC | PRN
Start: 2023-05-25 — End: ?

## 2023-05-28 DIAGNOSIS — H2513 Age-related nuclear cataract, bilateral: Secondary | ICD-10-CM | POA: Diagnosis not present

## 2023-05-28 DIAGNOSIS — H25812 Combined forms of age-related cataract, left eye: Secondary | ICD-10-CM | POA: Diagnosis not present

## 2023-12-07 ENCOUNTER — Ambulatory Visit
Admission: RE | Admit: 2023-12-07 | Discharge: 2023-12-07 | Disposition: A | Source: Ambulatory Visit | Attending: Family Medicine | Admitting: Family Medicine

## 2023-12-07 ENCOUNTER — Other Ambulatory Visit: Payer: Self-pay | Admitting: Family Medicine

## 2023-12-07 DIAGNOSIS — Z1231 Encounter for screening mammogram for malignant neoplasm of breast: Secondary | ICD-10-CM

## 2024-05-16 ENCOUNTER — Ambulatory Visit
